# Patient Record
Sex: Female | Born: 1970 | Race: White | Hispanic: No | Smoking: Never smoker
Health system: Southern US, Community
[De-identification: ages and names within clinical notes are randomized; demographics above are authoritative.]

## PROBLEM LIST (undated history)

## (undated) DIAGNOSIS — G4730 Sleep apnea, unspecified: Secondary | ICD-10-CM

## (undated) DIAGNOSIS — U099 Post covid-19 condition, unspecified: Secondary | ICD-10-CM

## (undated) DIAGNOSIS — R87619 Unspecified abnormal cytological findings in specimens from cervix uteri: Secondary | ICD-10-CM

## (undated) DIAGNOSIS — A599 Trichomoniasis, unspecified: Secondary | ICD-10-CM

## (undated) DIAGNOSIS — J189 Pneumonia, unspecified organism: Secondary | ICD-10-CM

## (undated) DIAGNOSIS — N939 Abnormal uterine and vaginal bleeding, unspecified: Secondary | ICD-10-CM

## (undated) DIAGNOSIS — G935 Compression of brain: Secondary | ICD-10-CM

## (undated) DIAGNOSIS — F329 Major depressive disorder, single episode, unspecified: Secondary | ICD-10-CM

## (undated) DIAGNOSIS — D649 Anemia, unspecified: Secondary | ICD-10-CM

## (undated) DIAGNOSIS — I10 Essential (primary) hypertension: Secondary | ICD-10-CM

## (undated) DIAGNOSIS — E785 Hyperlipidemia, unspecified: Secondary | ICD-10-CM

## (undated) DIAGNOSIS — K8020 Calculus of gallbladder without cholecystitis without obstruction: Secondary | ICD-10-CM

## (undated) DIAGNOSIS — F32A Depression, unspecified: Secondary | ICD-10-CM

## (undated) DIAGNOSIS — D219 Benign neoplasm of connective and other soft tissue, unspecified: Secondary | ICD-10-CM

## (undated) DIAGNOSIS — N870 Mild cervical dysplasia: Secondary | ICD-10-CM

## (undated) DIAGNOSIS — K219 Gastro-esophageal reflux disease without esophagitis: Secondary | ICD-10-CM

## (undated) DIAGNOSIS — E559 Vitamin D deficiency, unspecified: Secondary | ICD-10-CM

## (undated) DIAGNOSIS — T8859XA Other complications of anesthesia, initial encounter: Secondary | ICD-10-CM

## (undated) DIAGNOSIS — C801 Malignant (primary) neoplasm, unspecified: Secondary | ICD-10-CM

## (undated) HISTORY — DX: Sleep apnea, unspecified: G47.30

## (undated) HISTORY — DX: Abnormal uterine and vaginal bleeding, unspecified: N93.9

## (undated) HISTORY — DX: Essential (primary) hypertension: I10

## (undated) HISTORY — DX: Hyperlipidemia, unspecified: E78.5

## (undated) HISTORY — DX: Gastro-esophageal reflux disease without esophagitis: K21.9

## (undated) HISTORY — DX: Benign neoplasm of connective and other soft tissue, unspecified: D21.9

## (undated) HISTORY — DX: Mild cervical dysplasia: N87.0

## (undated) HISTORY — DX: Pneumonia, unspecified organism: J18.9

## (undated) HISTORY — DX: Unspecified abnormal cytological findings in specimens from cervix uteri: R87.619

## (undated) HISTORY — PX: ABDOMINAL HYSTERECTOMY: SHX81

## (undated) HISTORY — DX: Trichomoniasis, unspecified: A59.9

## (undated) HISTORY — DX: Anemia, unspecified: D64.9

## (undated) HISTORY — PX: TONSILLECTOMY: SUR1361

## (undated) HISTORY — DX: Major depressive disorder, single episode, unspecified: F32.9

## (undated) HISTORY — DX: Depression, unspecified: F32.A

## (undated) HISTORY — DX: Calculus of gallbladder without cholecystitis without obstruction: K80.20

## (undated) HISTORY — DX: Malignant (primary) neoplasm, unspecified: C80.1

## (undated) HISTORY — DX: Compression of brain: G93.5

## (undated) HISTORY — DX: Vitamin D deficiency, unspecified: E55.9

## (undated) HISTORY — PX: DILATION AND CURETTAGE OF UTERUS: SHX78

---
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
## 1990-04-06 DIAGNOSIS — G935 Compression of brain: Secondary | ICD-10-CM

## 1990-04-06 HISTORY — DX: Compression of brain: G93.5

## 1993-11-10 DIAGNOSIS — C801 Malignant (primary) neoplasm, unspecified: Secondary | ICD-10-CM

## 1993-11-10 HISTORY — DX: Malignant (primary) neoplasm, unspecified: C80.1

## 1994-11-10 HISTORY — PX: GYNECOLOGIC CRYOSURGERY: SHX857

## 1998-04-06 ENCOUNTER — Inpatient Hospital Stay (HOSPITAL_COMMUNITY): Admission: AD | Admit: 1998-04-06 | Discharge: 1998-04-08 | Payer: Self-pay | Admitting: Obstetrics and Gynecology

## 1998-11-10 DIAGNOSIS — N939 Abnormal uterine and vaginal bleeding, unspecified: Secondary | ICD-10-CM

## 1998-11-10 DIAGNOSIS — D219 Benign neoplasm of connective and other soft tissue, unspecified: Secondary | ICD-10-CM

## 1998-11-10 HISTORY — PX: BREAST LUMPECTOMY: SHX2

## 1998-11-10 HISTORY — DX: Abnormal uterine and vaginal bleeding, unspecified: N93.9

## 1998-11-10 HISTORY — DX: Benign neoplasm of connective and other soft tissue, unspecified: D21.9

## 1999-04-02 ENCOUNTER — Emergency Department (HOSPITAL_COMMUNITY): Admission: EM | Admit: 1999-04-02 | Discharge: 1999-04-02 | Payer: Self-pay | Admitting: Emergency Medicine

## 1999-04-08 ENCOUNTER — Encounter: Admission: RE | Admit: 1999-04-08 | Discharge: 1999-04-08 | Payer: Self-pay | Admitting: *Deleted

## 2000-11-10 HISTORY — PX: CRANIECTOMY: SHX331

## 2000-11-24 ENCOUNTER — Encounter: Admission: RE | Admit: 2000-11-24 | Discharge: 2000-11-24 | Payer: Self-pay | Admitting: Family Medicine

## 2000-11-24 ENCOUNTER — Encounter: Payer: Self-pay | Admitting: Family Medicine

## 2000-12-21 ENCOUNTER — Inpatient Hospital Stay (HOSPITAL_COMMUNITY): Admission: RE | Admit: 2000-12-21 | Discharge: 2000-12-24 | Payer: Self-pay | Admitting: Neurosurgery

## 2002-05-17 ENCOUNTER — Other Ambulatory Visit: Admission: RE | Admit: 2002-05-17 | Discharge: 2002-05-17 | Payer: Self-pay | Admitting: Obstetrics and Gynecology

## 2002-08-27 ENCOUNTER — Encounter: Payer: Self-pay | Admitting: Family Medicine

## 2002-08-27 ENCOUNTER — Encounter: Admission: RE | Admit: 2002-08-27 | Discharge: 2002-08-27 | Payer: Self-pay | Admitting: Family Medicine

## 2003-01-25 ENCOUNTER — Encounter: Admission: RE | Admit: 2003-01-25 | Discharge: 2003-01-25 | Payer: Self-pay | Admitting: Family Medicine

## 2003-01-25 ENCOUNTER — Encounter: Payer: Self-pay | Admitting: Family Medicine

## 2003-01-30 ENCOUNTER — Encounter: Payer: Self-pay | Admitting: Family Medicine

## 2003-01-30 ENCOUNTER — Encounter: Admission: RE | Admit: 2003-01-30 | Discharge: 2003-01-30 | Payer: Self-pay | Admitting: Family Medicine

## 2003-02-24 ENCOUNTER — Ambulatory Visit (HOSPITAL_COMMUNITY): Admission: RE | Admit: 2003-02-24 | Discharge: 2003-02-24 | Payer: Self-pay | Admitting: Obstetrics and Gynecology

## 2003-02-24 ENCOUNTER — Encounter (INDEPENDENT_AMBULATORY_CARE_PROVIDER_SITE_OTHER): Payer: Self-pay

## 2003-04-05 ENCOUNTER — Ambulatory Visit (HOSPITAL_COMMUNITY): Admission: RE | Admit: 2003-04-05 | Discharge: 2003-04-05 | Payer: Self-pay | Admitting: Obstetrics and Gynecology

## 2003-04-05 ENCOUNTER — Encounter: Payer: Self-pay | Admitting: Obstetrics and Gynecology

## 2003-04-07 ENCOUNTER — Encounter: Payer: Self-pay | Admitting: Family Medicine

## 2003-04-07 ENCOUNTER — Encounter: Admission: RE | Admit: 2003-04-07 | Discharge: 2003-04-07 | Payer: Self-pay | Admitting: Family Medicine

## 2003-05-11 ENCOUNTER — Other Ambulatory Visit: Admission: RE | Admit: 2003-05-11 | Discharge: 2003-05-11 | Payer: Self-pay | Admitting: Obstetrics and Gynecology

## 2004-02-15 ENCOUNTER — Ambulatory Visit (HOSPITAL_COMMUNITY): Admission: RE | Admit: 2004-02-15 | Discharge: 2004-02-15 | Payer: Self-pay | Admitting: General Surgery

## 2004-02-15 ENCOUNTER — Ambulatory Visit (HOSPITAL_BASED_OUTPATIENT_CLINIC_OR_DEPARTMENT_OTHER): Admission: RE | Admit: 2004-02-15 | Discharge: 2004-02-15 | Payer: Self-pay | Admitting: General Surgery

## 2004-02-15 ENCOUNTER — Encounter (INDEPENDENT_AMBULATORY_CARE_PROVIDER_SITE_OTHER): Payer: Self-pay | Admitting: *Deleted

## 2004-05-17 ENCOUNTER — Other Ambulatory Visit: Admission: RE | Admit: 2004-05-17 | Discharge: 2004-05-17 | Payer: Self-pay | Admitting: Obstetrics and Gynecology

## 2005-05-20 ENCOUNTER — Other Ambulatory Visit: Admission: RE | Admit: 2005-05-20 | Discharge: 2005-05-20 | Payer: Self-pay | Admitting: Obstetrics and Gynecology

## 2007-05-21 ENCOUNTER — Encounter (HOSPITAL_COMMUNITY): Admission: RE | Admit: 2007-05-21 | Discharge: 2007-06-20 | Payer: Self-pay | Admitting: Orthopaedic Surgery

## 2007-06-22 ENCOUNTER — Encounter (HOSPITAL_COMMUNITY): Admission: RE | Admit: 2007-06-22 | Discharge: 2007-07-22 | Payer: Self-pay | Admitting: Orthopaedic Surgery

## 2008-07-19 ENCOUNTER — Ambulatory Visit: Payer: Self-pay | Admitting: Internal Medicine

## 2008-07-19 DIAGNOSIS — J029 Acute pharyngitis, unspecified: Secondary | ICD-10-CM | POA: Insufficient documentation

## 2008-10-06 ENCOUNTER — Emergency Department (HOSPITAL_COMMUNITY): Admission: EM | Admit: 2008-10-06 | Discharge: 2008-10-06 | Payer: Self-pay | Admitting: Emergency Medicine

## 2009-05-08 ENCOUNTER — Encounter: Admission: RE | Admit: 2009-05-08 | Discharge: 2009-05-08 | Payer: Self-pay | Admitting: Obstetrics and Gynecology

## 2009-05-09 ENCOUNTER — Ambulatory Visit (HOSPITAL_COMMUNITY): Admission: RE | Admit: 2009-05-09 | Discharge: 2009-05-09 | Payer: Self-pay | Admitting: Interventional Radiology

## 2009-07-19 ENCOUNTER — Encounter: Payer: Self-pay | Admitting: Obstetrics and Gynecology

## 2009-07-19 ENCOUNTER — Ambulatory Visit (HOSPITAL_COMMUNITY): Admission: RE | Admit: 2009-07-19 | Discharge: 2009-07-20 | Payer: Self-pay | Admitting: Obstetrics and Gynecology

## 2009-08-08 ENCOUNTER — Encounter: Admission: RE | Admit: 2009-08-08 | Discharge: 2009-08-08 | Payer: Self-pay | Admitting: Interventional Radiology

## 2009-11-10 HISTORY — PX: UTERINE ARTERY EMBOLIZATION: SHX2629

## 2009-12-06 ENCOUNTER — Ambulatory Visit (HOSPITAL_COMMUNITY): Admission: RE | Admit: 2009-12-06 | Discharge: 2009-12-06 | Payer: Self-pay | Admitting: Family Medicine

## 2009-12-13 ENCOUNTER — Ambulatory Visit (HOSPITAL_COMMUNITY): Admission: RE | Admit: 2009-12-13 | Discharge: 2009-12-13 | Payer: Self-pay | Admitting: Family Medicine

## 2010-02-06 ENCOUNTER — Encounter: Admission: RE | Admit: 2010-02-06 | Discharge: 2010-02-06 | Payer: Self-pay | Admitting: Interventional Radiology

## 2010-02-06 ENCOUNTER — Encounter: Admission: RE | Admit: 2010-02-06 | Discharge: 2010-02-06 | Payer: Self-pay | Admitting: Obstetrics and Gynecology

## 2010-03-04 ENCOUNTER — Ambulatory Visit: Admission: RE | Admit: 2010-03-04 | Discharge: 2010-03-04 | Payer: Self-pay | Admitting: Family Medicine

## 2010-03-14 ENCOUNTER — Encounter: Payer: Self-pay | Admitting: Family Medicine

## 2010-12-02 ENCOUNTER — Encounter: Payer: Self-pay | Admitting: Interventional Radiology

## 2010-12-10 NOTE — Miscellaneous (Signed)
Summary: appt with Dr. Gerilyn Pilgrim  Clinical Lists Changes [facility] referred her here to see Dr. Gerilyn Pilgrim. appt made & forms in Dr. Johnell Comings box.Golden Circle RN  Mar 14, 2010 4:43 PM

## 2011-02-14 LAB — CBC
HCT: 33 % — ABNORMAL LOW (ref 36.0–46.0)
Hemoglobin: 11.1 g/dL — ABNORMAL LOW (ref 12.0–15.0)
MCV: 84.7 fL (ref 78.0–100.0)
Platelets: 369 10*3/uL (ref 150–400)
RBC: 3.9 MIL/uL (ref 3.87–5.11)
RDW: 12.6 % (ref 11.5–15.5)
WBC: 8.5 10*3/uL (ref 4.0–10.5)

## 2011-02-14 LAB — HCG, SERUM, QUALITATIVE: Preg, Serum: NEGATIVE

## 2011-03-28 NOTE — Discharge Summary (Signed)
[place]  Patient:    Karen Avila, Karen Avila                  MRN: [id number] Adm. Date:  [id number] Disc. Date: 12/24/00 Attending:  Gerald Dexter                           Discharge Summary  PRIMARY DIAGNOSIS:  Chiari malformation.  PRIMARY OPERATIVE PROCEDURE:  Suboccipital craniotomy and C1 laminectomy for decompression of Chiari malformation.  HISTORY OF PRESENT ILLNESS:  Karen Avila is a 40 year old female who presented to the office with a history of increasing headaches particularly associated with straining, coughing, or hard laughing.  These were in the occipital region and were getting progressively worse.  She had been evaluated with an MRI scan which showed a Chiari malformation.  After evaluation and discussing the options, she requested surgery.  She was admitted at this time for surgical intervention.  HOSPITAL COURSE:  The patient was admitted on December 21, 2000.  She was taken to the operating room where she underwent the suboccipital craniectomy which she tolerated without difficulty.  Her postoperative course was unremarkable.  She was transferred the ACU on the first postoperative day.  On the second postoperative day, she was able to increase her activity and advance her diet without difficulty.  By December 24, 2000, the patient was up ambulating well and tolerating a regular diet.  Her wound was healing well. It was felt that she could be discharged home.  DISCHARGE MEDICATIONS:  Vicodin for pain.  FOLLOW-UP:  The plan was for the patient to return to the office in five days time for suture removal.  CONDITION ON DISCHARGE:  Improved versus admission. DD:  12/24/00 TD:  12/25/00 Job: [id number]

## 2011-03-28 NOTE — Op Note (Signed)
NAME:  Karen Avila, Karen Avila                     ACCOUNT NO.:  [id number]   MEDICAL RECORD NO.:  [id number]                   PATIENT TYPE:  AMB   LOCATION:  SDC                                  FACILITY:  [facility]   PHYSICIAN:  Randye Lobo, M.D.                DATE OF BIRTH:  11-Mar-1971   DATE OF PROCEDURE:  02/24/2003  DATE OF DISCHARGE:                                 OPERATIVE REPORT   PREOPERATIVE DIAGNOSES:  1. Missed abortion.  2. 6+[redacted] weeks gestation.   POSTOPERATIVE DIAGNOSES:  1. Missed abortion.  2. 6+[redacted] weeks gestation.   PROCEDURE:  Dilation and evacuation.   SURGEON:  Randye Lobo, M.D.   ANESTHESIA:  MAC, paracervical block.   IV FLUIDS:  700 mL Ringer's lactate.   ESTIMATED BLOOD LOSS:  Minimal.   URINE OUTPUT:  100 mL prior to procedure by I&O catheterization.   COMPLICATIONS:  None.   INDICATIONS FOR PROCEDURE:  The patient is a 40 year old gravida 4, para 1-0-  2-1 Caucasian female with a last menstrual period December 28, 2002 who had  a positive pregnancy test done February 10, 2003 and an ultrasound demonstrating  a viable intrauterine gestation at 5+6 weeks on February 14, 2003.  The patient  presented on February 21, 2003 to the office with bleeding and cramping.  At  that time ultrasound documented a 6+[redacted] week gestation with no fetal cardiac  activity.  The gestational sac at that time was irregular and appeared to be  collapsing.  Initially, the patient chose to proceed with spontaneous  process for the missed abortion but then she decided to proceed with a D&C  instead.  The patient was counseled regarding risks, benefits, and  alternatives prior to the surgery.   FINDINGS:  Examination under anesthesia revealed a 6 week sized anteverted,  mobile uterus.  No adnexal masses were appreciated.  There was bloody mucus  coming from the cervical os.   A small amount of products of conception were obtained and sent to  pathology.   PROCEDURE:  With an IV in  place the patient was taken to the operating room  after she was properly identified.  The patient did receive Ancef 1 g  intravenously for antibiotic prophylaxis.  The patient was brought down to  the operating room suite and she was placed in the dorsal lithotomy  position.  MAC anesthesia was induced.  The patient's vagina and perineum  were then sterilely prepped and the bladder was catheterized of urine.  The  patient was then sterilely draped and an examination under anesthesia was  performed.   A speculum was placed in the vagina.  The single tooth tenaculum was placed  on the anterior cervical lip.  The uterus was sounded and then the cervix  was sequentially dilated to a number 23 Pratt dilator.  Prior to the  placement of the uterine sound, please note that a  paracervical block was  performed in a standard fashion with a total of 10 mL of 1% lidocaine.  A  number 7 suction tip curette was next introduced through the cervical os to  the level of the uterine fundus and proper suction was applied.  The  catheter tip was turned in a clockwise fashion while withdrawing the  catheter from within the uterine cavity.  This was repeated two additional  times to remove products of conception and sent them to pathology.  A sharp  curette was then introduced through the cervix to the level of the uterine  fundus and all four quadrants were gently curetted to remove remaining  products of conception.  The suction tip curette was introduced into the  uterine cavity one final time and again was removed while turning in a  clockwise fashion to remove any remaining blood clots.  Hemostasis was noted  to be excellent.  At this time all instruments were removed from the vagina  and the patient's procedure was concluded.   The patient was awakened and sent to the recovery room in stable and awake  condition.  There were no complications.  All needle, sponge, and instrument  counts were  correct.                                               Randye Lobo, M.D.    BES/MEDQ  D:  02/24/2003  T:  02/24/2003  Job:  [id number]

## 2011-03-28 NOTE — Op Note (Signed)
[place]  Patient:    Karen Avila, Karen Avila                    MRN: [id number] Proc. Date: 12/21/00 Adm. Date:  12/21/00 Attending:  Reinaldo Meeker, M.D.                           Operative Report  PREOPERATIVE DIAGNOSIS:  Chiari malformation.  POSTOPERATIVE DIAGNOSIS:  Chiari malformation.  PROCEDURE:  Suboccipital craniectomy, C1 laminectomy for decompression of Chiari malformation, followed by dural patch graft.   Secondary procedure of microdissection of suboccipital region and cerebellar tonsils.  SURGEON:  Reinaldo Meeker, M.D.  ASSISTANT:  Julio Sicks, M.D.  PROCEDURE IN DETAIL:  After being placed in the supine position and 3-point pin fixation, the patients occipital region and neck were shaved, prepped and draped in usual sterile fashion.  A linear incision was made starting in the low edges of the occipital bone headed towards the spinous process of C2. Using the Bovie current, the soft tissues were dissected in the midline.  A very thorough exposure was carried out of the suboccipital region down to the foramen magnum, as well as the ring of C1 and the spinous processes and lamina of C2.  Self-retaining retractors were placed for exposure.  The high-speed drill was used to make two holes in the suboccipital region bone and that high-speed drill was used to thin out the surrounding area of the suboccipital region.  The bone was then removed in a piecemeal fashion using Kerrison and Leksell rongeurs.  This was carried down to the foramen magnum, which was widely opened to complete the suboccipital craniectomy.  The ring of C1 was then drilled thin with the high-speed drill and then removed posteriorly to complete the decompression at that level.  At this point, irrigation was carried out.  A dural suture was then placed in the dura and the dura was opened in a linear fashion starting just superior to the C2 lamina and heading up  into the higher areas of the suboccipital region that had been exposed. Traction sutures were used to spread the dura widely and any bleeding on the dural edges was controlled with bipolar coagulation.  The microscope was draped and brought into the field at this time.  I used microdissection technique.  The cerebellar tonsils were identified and gently dissected free of each other with arachnoidal dissection carried out until the choroid plexus of the fourth ventricle and the ______ could be identified.  No further adhesions were noted between the cerebellar tonsils.  Large amount of irrigation were carried out at this time and bleeding controlled with bipolar coagulation.  An appropriately lengthed sized dural patch graft was then fashioned to the appropriate shape and sewed with interrupted Nurolon stitches.  Once this was well-secured, Tisseel fibrin glue product was placed over it to secure watertight closure.  The wound was then covered with Gelfoam and irrigated copiously.  It was then closed in multiple layers of Vicryl and a running locking nylon on the skin.  A sterile dressing was then applied. The patient was then taken to the recovery room in stable condition. DD:  12/21/00 TD:  12/21/00 Job: [id number]

## 2011-03-28 NOTE — Op Note (Signed)
NAME:  Karen Avila, Karen Avila                     ACCOUNT NO.:  [id number]   MEDICAL RECORD NO.:  [id number]                   PATIENT TYPE:  AMB   LOCATION:  DSC                                  FACILITY:  [facility]   PHYSICIAN:  Rose Phi. Maple Hudson, M.D.                DATE OF BIRTH:  Nov 28, 1970   DATE OF PROCEDURE:  02/15/2004  DATE OF DISCHARGE:                                 OPERATIVE REPORT   PREOPERATIVE DIAGNOSIS:  Intraductal papilloma of the left breast.   POSTOPERATIVE DIAGNOSIS:  Intraductal papilloma of the left breast.   OPERATION:  Excision of intraductal papilloma of the left breast.   SURGEON:  Rose Phi. Maple Hudson, M.D.   ANESTHESIA:  MAC.   OPERATIVE PROCEDURE:  The patient placed on the operating table and the left  breast prepped and draped in the usual fashion.  It appeared that the clear  fluid that was coming was from about the 7 o'clock position of the left  breast.  A circumareolar incision based at the 6 o'clock position was then  outlined with a marking pencil and the area thoroughly infiltrated with a  mixture of 1% Xylocaine and 0.25% Marcaine.  Incision was made, and I  dissected an areolar flap and then began to excise this and identified  clearly the papilloma, and we excised that area of the duct system.  Hemostasis obtained with the cautery.  Subcuticular closure of 4-0 Monocryl  and Steri-Strips carried out.  Dressing applied.  Patient transferred to the  recovery room in satisfactory condition, having tolerated the procedure  well.                                               Rose Phi. Maple Hudson, M.D.    PRY/MEDQ  D:  02/15/2004  T:  02/15/2004  Job:  [id number]

## 2013-01-21 ENCOUNTER — Encounter (HOSPITAL_BASED_OUTPATIENT_CLINIC_OR_DEPARTMENT_OTHER): Payer: Self-pay | Admitting: *Deleted

## 2013-01-21 ENCOUNTER — Emergency Department (HOSPITAL_BASED_OUTPATIENT_CLINIC_OR_DEPARTMENT_OTHER): Payer: BC Managed Care – PPO

## 2013-01-21 ENCOUNTER — Emergency Department (HOSPITAL_BASED_OUTPATIENT_CLINIC_OR_DEPARTMENT_OTHER)
Admission: EM | Admit: 2013-01-21 | Discharge: 2013-01-22 | Disposition: A | Discharge status: Home / Self Care | Payer: BC Managed Care – PPO | Attending: Emergency Medicine | Admitting: Emergency Medicine

## 2013-01-21 DIAGNOSIS — D259 Leiomyoma of uterus, unspecified: Secondary | ICD-10-CM | POA: Insufficient documentation

## 2013-01-21 DIAGNOSIS — N760 Acute vaginitis: Secondary | ICD-10-CM | POA: Insufficient documentation

## 2013-01-21 DIAGNOSIS — B9689 Other specified bacterial agents as the cause of diseases classified elsewhere: Secondary | ICD-10-CM

## 2013-01-21 DIAGNOSIS — Z3202 Encounter for pregnancy test, result negative: Secondary | ICD-10-CM | POA: Insufficient documentation

## 2013-01-21 LAB — WET PREP, GENITAL: Yeast Wet Prep HPF POC: NONE SEEN

## 2013-01-21 LAB — URINALYSIS, ROUTINE W REFLEX MICROSCOPIC
Bilirubin Urine: NEGATIVE
Glucose, UA: NEGATIVE mg/dL
Ketones, ur: NEGATIVE mg/dL
Leukocytes, UA: NEGATIVE
Specific Gravity, Urine: 1.027 (ref 1.005–1.030)
pH: 5 (ref 5.0–8.0)

## 2013-01-21 LAB — URINE MICROSCOPIC-ADD ON

## 2013-01-21 MED ORDER — METRONIDAZOLE 500 MG PO TABS: 500.0000 mg | ORAL_TABLET | Freq: Two times a day (BID) | ORAL | Status: DC

## 2013-01-21 MED ORDER — LIDOCAINE HCL (PF) 1 % IJ SOLN
5.0000 mL | Freq: Once | INTRAMUSCULAR | Status: AC
  Administered 2013-01-21: 2.2 mL

## 2013-01-21 MED ORDER — CEFTRIAXONE SODIUM 250 MG IJ SOLR
250.0000 mg | Freq: Once | INTRAMUSCULAR | Status: AC
  Administered 2013-01-21: 250 mg via INTRAMUSCULAR
  Filled 2013-01-21: qty 250

## 2013-01-21 MED ORDER — LIDOCAINE HCL (PF) 1 % IJ SOLN
INTRAMUSCULAR | Status: AC
  Administered 2013-01-21: 2.2 mL
  Filled 2013-01-21: qty 5

## 2013-01-21 MED ORDER — AZITHROMYCIN 250 MG PO TABS
1000.0000 mg | ORAL_TABLET | Freq: Once | ORAL | Status: AC
  Administered 2013-01-21: 1000 mg via ORAL
  Filled 2013-01-21: qty 4

## 2013-01-21 NOTE — ED Notes (Signed)
States she has a tampon stuck in her her vagina. Has been there for a month. Vaginal bleeding and odor.

## 2013-01-21 NOTE — ED Provider Notes (Signed)
History     CSN: [id number]  Arrival date & time 01/21/13  1933   First MD Initiated Contact with Patient 01/21/13 2123      Chief Complaint  Patient presents with  . Foreign Body in Vagina    (Consider location/radiation/quality/duration/timing/severity/associated sxs/prior treatment) Patient is a 42 y.o. female presenting with foreign body in vagina. The history is provided by the patient. No language interpreter was used.  Foreign Body in Vagina This is a new problem. The current episode started more than 1 month ago. The problem occurs constantly. The problem has been gradually worsening. Nothing aggravates the symptoms. She has tried nothing for the symptoms. The treatment provided moderate relief.  Pt complains of vaginal discharge and odor.  Pt reports she thinks she has a tampon in.   Pt reports she thought it had been in for a month.  Pt is an ultrasound tech and looked with ultrasound.   Pt thinks she sees a foreign body  History reviewed. No pertinent past medical history.  Past Surgical History  Procedure Laterality Date  . Appendectomy      No family history on file.  History  Substance Use Topics  . Smoking status: Never Smoker   . Smokeless tobacco: Not on file  . Alcohol Use: Yes    OB History   Grav Para Term Preterm Abortions TAB SAB Ect Mult Living                  Review of Systems  Genitourinary: Positive for vaginal discharge.  All other systems reviewed and are negative.    Allergies  Review of patient's allergies indicates no known allergies.  Home Medications  No current outpatient prescriptions on file.  BP 129/78  Pulse 78  Temp(Src) 98.2 F (36.8 C) (Oral)  Resp 16  Ht 5\' 2"  (1.575 m)  Wt 151 lb (68.493 kg)  BMI 27.61 kg/m2  SpO2 100%  LMP 12/11/2012  Physical Exam  Constitutional: She is oriented to person, place, and time. She appears well-developed and well-nourished.  HENT:  Head: Normocephalic.  Eyes: EOM are  normal.  Neck: Normal range of motion.  Cardiovascular: Normal rate and regular rhythm.   Pulmonary/Chest: Effort normal.  Abdominal: Soft. She exhibits no distension.  Genitourinary: Vaginal discharge found.  Greenish discharge  No foreign body,   Cervical cyst.    Musculoskeletal: Normal range of motion.  Neurological: She is alert and oriented to person, place, and time.  Psychiatric: She has a normal mood and affect.    ED Course  Procedures (including critical care time)  Labs Reviewed  WET PREP, GENITAL - Abnormal; Notable for the following:    Clue Cells Wet Prep HPF POC FEW (*)    WBC, Wet Prep HPF POC FEW (*)    All other components within normal limits  URINALYSIS, ROUTINE W REFLEX MICROSCOPIC - Abnormal; Notable for the following:    Hgb urine dipstick SMALL (*)    All other components within normal limits  GC/CHLAMYDIA PROBE AMP  PREGNANCY, URINE  URINE MICROSCOPIC-ADD ON   No results found.   1. BV (bacterial vaginosis)   2. Fibroid, uterine       MDM  Ultrasound shows a fibroid.   gc and ct pending.   Pt given rocephin and zithromax and flaggyl rx  Pt advised to see Dr. Edward Jolly for evaluation        Elson Areas, PA-C 01/21/13 2306  Lonia Skinner [place], PA-C 01/21/13 2308

## 2013-01-22 NOTE — ED Provider Notes (Signed)
Medical screening examination/treatment/procedure(s) were performed by non-physician practitioner and as supervising physician I was immediately available for consultation/collaboration.   Carleene Cooper III, MD 01/22/13 [phone]

## 2013-01-26 ENCOUNTER — Ambulatory Visit: Payer: Self-pay | Admitting: Obstetrics and Gynecology

## 2013-02-08 ENCOUNTER — Ambulatory Visit: Payer: Self-pay | Admitting: Obstetrics and Gynecology

## 2013-02-10 ENCOUNTER — Encounter: Payer: Self-pay | Admitting: Obstetrics and Gynecology

## 2013-02-10 ENCOUNTER — Ambulatory Visit (INDEPENDENT_AMBULATORY_CARE_PROVIDER_SITE_OTHER): Payer: BC Managed Care – PPO | Admitting: Obstetrics and Gynecology

## 2013-02-10 ENCOUNTER — Other Ambulatory Visit: Payer: Self-pay | Admitting: Obstetrics and Gynecology

## 2013-02-10 VITALS — BP 122/70 | Ht 61.5 in | Wt 151.0 lb

## 2013-02-10 DIAGNOSIS — R921 Mammographic calcification found on diagnostic imaging of breast: Secondary | ICD-10-CM

## 2013-02-10 DIAGNOSIS — N6012 Diffuse cystic mastopathy of left breast: Secondary | ICD-10-CM

## 2013-02-10 DIAGNOSIS — N6019 Diffuse cystic mastopathy of unspecified breast: Secondary | ICD-10-CM

## 2013-02-10 DIAGNOSIS — Z01419 Encounter for gynecological examination (general) (routine) without abnormal findings: Secondary | ICD-10-CM

## 2013-02-10 DIAGNOSIS — Z0000 Encounter for general adult medical examination without abnormal findings: Secondary | ICD-10-CM

## 2013-02-10 DIAGNOSIS — R928 Other abnormal and inconclusive findings on diagnostic imaging of breast: Secondary | ICD-10-CM

## 2013-02-10 LAB — POCT URINALYSIS DIPSTICK
Glucose, UA: NEGATIVE
Ketones, UA: NEGATIVE
Leukocytes, UA: NEGATIVE
Nitrite, UA: NEGATIVE
Protein, UA: NEGATIVE

## 2013-02-10 NOTE — Addendum Note (Signed)
Addended by: Conley Simmonds on: 02/10/2013 10:22 PM   Modules accepted: Orders

## 2013-02-10 NOTE — Patient Instructions (Signed)

## 2013-02-10 NOTE — Progress Notes (Signed)
Patient ID: Karen Avila, female   DOB: 1970/12/20, 42 y.o.   MRN: [id number]  42 y.o. DivorcedCaucasian female  [id number] here for annual exam.   Status post uterine artery embolization Sept. 2011.  Menses light since procedure, just last a week.  February menses spotting or light bleeding for most of the month.  Went to ER for evaluation - no retained tampon.  Treated with Flagyl.  Menses normal in March.  Occasional cramping.    Patient's last menstrual period was 01/08/2013.          Sexually active: yes  The current method of family planning is condoms most of the time.    Exercising:  No.  Walks the dog.   Last mammogram:  June 2013.  Calcifications.  Had right stereotactic biopsy 2013 at [place] near [place].  Believes she had a cyst of the left as well.  Ordered through PCP.   Last pap smear:  2012, normal. History of abnormal pap:  1996 - had cryotherapy.  Follow up paps normal.   Smoking:  No. Alcohol:  1 -2 drinks a week. Last colonoscopy:  Never. Last Bone Density:  Never. Last tetanus shot:  March 2014. Last cholesterol check:  2012 - HDL was low, LDL Ok per patient.  Dietary recommendations only.    Hgb:  12.1              Urine:  Negative    Health Maintenance  Topic Date Due  . Influenza Vaccine  07/11/1970  . Pap Smear  06/17/1988  . Tetanus/tdap  06/17/1989    Family History  Problem Relation Age of Onset  . Osteoarthritis Mother   . Asthma Mother   . Cancer Mother     skin cancer  . Hyperlipidemia Mother   . Hypertension Mother   . Migraines Mother   . Stroke Mother   . Hyperlipidemia Father   . Hypertension Father   . Diabetes Sister   . Breast cancer Maternal Grandmother   . Heart failure Maternal Grandmother     Patient Active Problem List  Diagnosis  . EXUDATIVE PHARYNGITIS    Past Medical History  Diagnosis Date  . Fibroid 2000  . Abnormal uterine bleeding 2000    fibroids  . Anemia     past h/o anemia  . 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 Migraines     Past Surgical History  Procedure Laterality Date  . Appendectomy  1977  . Breast surgery Left 2000    lumpectomy  . Gynecologic cryosurgery  1996  . Uterine artery embolization  2011    Allergies: Review of patient's allergies indicates no known allergies.  Current Outpatient Prescriptions  Medication Sig Dispense Refill  . Acetaminophen (TYLENOL PO) Take by mouth as needed.       No current facility-administered medications for this visit.    ROS: Pertinent items are noted in HPI.  Exam:    BP 122/70  Ht 5' 1.5" (1.562 m)  Wt 151 lb (68.493 kg)  BMI 28.07 kg/m2  LMP 01/08/2013   Wt Readings from Last 3 Encounters:  02/10/13 151 lb (68.493 kg)  01/21/13 151 lb (68.493 kg)     Ht Readings from Last 3 Encounters:  02/10/13 5' 1.5" (1.562 m)  01/21/13 5\' 2"  (1.575 m)    General appearance: alert, cooperative and appears stated age Head:  Normocephalic, without obvious abnormality, atraumatic Neck: no adenopathy, supple, symmetrical, trachea midline and thyroid not enlarged, symmetric, no tenderness/mass/nodules Lungs: clear to auscultation bilaterally Breasts: Inspection negative, No nipple retraction or dimpling, No nipple discharge or bleeding, No axillary or supraclavicular adenopathy, Normal to palpation without dominant masses.  Left breast with areolar scar consistent with prior surgery. Heart: regular rate and rhythm Abdomen: soft, non-tender; bowel sounds normal; no masses,  no organomegaly Extremities: extremities normal, atraumatic, no cyanosis or edema Skin: Skin color, texture, turgor normal. No rashes or lesions Lymph nodes: Cervical, supraclavicular, and axillary nodes normal. No abnormal inguinal nodes palpated Neurologic: Grossly normal   Pelvic: External genitalia:  no lesions              Urethra:  normal appearing urethra with no  masses, tenderness or lesions              Bartholins and Skenes: normal                 Vagina: normal appearing vagina with normal color and discharge, no lesions.  Friable.              Cervix: normal appearance              Pap taken: yes and HR HPV        Bimanual Exam:  Uterus:  uterus is somewhat enlarged in size.  Nontender.  Exam limited by full bladder and body habitus.                                      Adnexa: normal adnexa in size, nontender and no masses                                      Rectovaginal: Confirms                                      Anus:  normal sphincter tone, no lesions  A: Fibroid uterus.  Status post embolization. Calcifications of right breast. Fibrocystic change of the left breast.   History of cervical cryotherapy.     P[facility] Pap and HR HPV testing. Observations of cycles. return annually or prn     An After Visit Summary was printed and given to the patient.

## 2013-02-11 ENCOUNTER — Ambulatory Visit: Payer: Self-pay | Admitting: Obstetrics and Gynecology

## 2013-02-11 ENCOUNTER — Ambulatory Visit: Payer: BC Managed Care – PPO | Admitting: Obstetrics and Gynecology

## 2013-02-14 LAB — HEMOGLOBIN, FINGERSTICK: Hemoglobin, fingerstick: 12.1 g/dL (ref 12.0–16.0)

## 2013-02-24 ENCOUNTER — Other Ambulatory Visit: Payer: Self-pay

## 2013-03-04 LAB — IPS PAP TEST WITH HPV

## 2013-03-11 ENCOUNTER — Ambulatory Visit
Admission: RE | Admit: 2013-03-11 | Discharge: 2013-03-11 | Disposition: A | Discharge status: Home / Self Care | Payer: BC Managed Care – PPO | Source: Ambulatory Visit | Attending: Obstetrics and Gynecology | Admitting: Obstetrics and Gynecology

## 2013-03-11 DIAGNOSIS — N6012 Diffuse cystic mastopathy of left breast: Secondary | ICD-10-CM

## 2013-03-11 DIAGNOSIS — R921 Mammographic calcification found on diagnostic imaging of breast: Secondary | ICD-10-CM

## 2013-09-20 ENCOUNTER — Telehealth: Payer: Self-pay | Admitting: Obstetrics and Gynecology

## 2013-09-20 NOTE — Telephone Encounter (Signed)
Patient is having some menopausal symptoms.

## 2013-09-20 NOTE — Telephone Encounter (Signed)
Spoke with pt about symptoms. Pt is having more crankiness, mood swings, and hot flashes than normal. Pt talked to co worker who is on effexor, and is wondering if that would be a good choice for her. Pt was on seraphim years ago during her divorce, but it helped only with mood swings, not hot flashes. Scheduled appt with PG 09-30-13 at 2:15 as there are no appts available with BS.

## 2013-09-30 ENCOUNTER — Encounter: Payer: Self-pay | Admitting: Nurse Practitioner

## 2013-09-30 ENCOUNTER — Ambulatory Visit (INDEPENDENT_AMBULATORY_CARE_PROVIDER_SITE_OTHER): Payer: BC Managed Care – PPO | Admitting: Nurse Practitioner

## 2013-09-30 VITALS — BP 118/60 | HR 82 | Resp 18 | Wt 156.0 lb

## 2013-09-30 DIAGNOSIS — N951 Menopausal and female climacteric states: Secondary | ICD-10-CM

## 2013-09-30 LAB — LIPID PANEL
Cholesterol: 238 mg/dL — ABNORMAL HIGH (ref 0–200)
HDL: 52 mg/dL (ref >39)
Total CHOL/HDL Ratio: 4.6 Ratio
Triglycerides: 206 mg/dL — ABNORMAL HIGH (ref <150)
VLDL: 41 mg/dL — ABNORMAL HIGH (ref 0–40)

## 2013-09-30 LAB — COMPREHENSIVE METABOLIC PANEL
AST: 12 U/L (ref 0–37)
Alkaline Phosphatase: 69 U/L (ref 39–117)
BUN: 8 mg/dL (ref 6–23)
Creat: 0.64 mg/dL (ref 0.50–1.10)
Glucose, Bld: 80 mg/dL (ref 70–99)
Potassium: 3.7 mEq/L (ref 3.5–5.3)
Total Bilirubin: 0.5 mg/dL (ref 0.3–1.2)

## 2013-09-30 MED ORDER — METRONIDAZOLE 1 % EX GEL: Freq: Every day | CUTANEOUS | Status: DC

## 2013-09-30 MED ORDER — VENLAFAXINE HCL ER 37.5 MG PO CP24: 37.5000 mg | ORAL_CAPSULE | Freq: Every day | ORAL | Status: DC

## 2013-09-30 NOTE — Progress Notes (Signed)
Subjective:     Patient ID: Karen Avila, female   DOB: 1971/03/26, 42 y.o.   MRN: [id number]  HPI  This 42 yo WD Fe presents with history of irregular menses at 6-8 weeks apart.  Flow is normal.  She is having increase in vaso symptoms especially at night.  The most worrisome thing for her is the mood swings and irritability.  She has been of Prozac in the past for PMDD which was very helpful.  She would like to try Effexor since it will also help with hot flashes.  She is not dating now.  No other early menopausal symptoms - but does stare all female members of her family went through menopause early.  She also has problems with Rosacea and wants refill of Metrogel 1 % aqueous gel for her face.  The original Rx given by Dr. Edward Jolly.   Review of Systems  Constitutional: Negative.   HENT: Negative.   Respiratory: Negative.   Cardiovascular: Negative.   Gastrointestinal: Negative.   Genitourinary: Positive for menstrual problem.       Irregular menses  Musculoskeletal: Negative.   Skin: Positive for rash.       Hair thinning.  Neurological: Negative.   Psychiatric/Behavioral: Positive for sleep disturbance, dysphoric mood, decreased concentration and agitation. The patient is nervous/anxious.        Objective:   Physical Exam  Constitutional: She is oriented to person, place, and time. She appears well-developed and well-nourished. No distress.  Physical exam not indicated at this time.  Neurological: She is alert and oriented to person, place, and time.  Skin: Skin is warm and dry.  Psychiatric: She has a normal mood and affect. Her behavior is normal. Thought content normal.  Denies suicidal thoughts and behaviors.       Assessment:     History of PMS/ PMDD Perimenopausal symptoms. Rosacea    Plan:     Will start on Effexor XR 37.5 mg daily until next AEX first of year with Dr. Edward Jolly - then review effectiveness.  She is to report any negative mood changes or thoughts of  suicide.  A refill on Metrogel is also given per request. Baseline labs are drawn before med's are started and will be repeated at AEX with cholesterol and CMP.

## 2013-09-30 NOTE — Patient Instructions (Signed)
Recheck at AEX.  Will call with labs.

## 2013-10-01 LAB — VITAMIN D 25 HYDROXY (VIT D DEFICIENCY, FRACTURES): Vit D, 25-Hydroxy: 14 ng/mL — ABNORMAL LOW (ref 30–89)

## 2013-10-03 NOTE — Progress Notes (Signed)
Encounter reviewed by Dr. Leathia Farnell Silva.  

## 2013-10-04 ENCOUNTER — Encounter: Payer: Self-pay | Admitting: Nurse Practitioner

## 2013-10-05 ENCOUNTER — Other Ambulatory Visit: Payer: Self-pay | Admitting: Nurse Practitioner

## 2013-10-05 MED ORDER — VITAMIN D (ERGOCALCIFEROL) 1.25 MG (50000 UNIT) PO CAPS: 50000.0000 [IU] | ORAL_CAPSULE | ORAL | Status: DC

## 2013-10-05 NOTE — Telephone Encounter (Signed)
I have attempted to contact this patient by phone with the following results: left message to return my call on answering machine (home).  

## 2013-10-05 NOTE — Progress Notes (Signed)
Vitamin D rx sent per result note. 

## 2013-10-05 NOTE — Telephone Encounter (Signed)
Message copied by Luisa Dago on Wed Oct 05, 2013 10:45 AM ------      Message from: Ria Comment R      Created: Mon Oct 03, 2013  8:37 AM       Let patient know that Lipid panel is elevated.  We dont have a previous chart to compare.  She needs to follow with PCP about this.  We also have a very low Vit D and needs Vit D per protocol.  CMP and TSH are both normal. ------

## 2013-12-15 ENCOUNTER — Telehealth: Payer: Self-pay | Admitting: Obstetrics and Gynecology

## 2013-12-15 NOTE — Telephone Encounter (Signed)
Patient has some questions about her medications She only has 1 vitamin D left didn't know if she needed to continue the same one or if she was switching to over the counter She was started on effexor but it is not helping with the hotflashes

## 2013-12-16 MED ORDER — VENLAFAXINE HCL ER 75 MG PO CP24: 75.0000 mg | ORAL_CAPSULE | Freq: Every day | ORAL | Status: DC

## 2013-12-16 NOTE — Telephone Encounter (Signed)
2 things: we can give enough Vit D until her AEX with Dr. Quincy Simmonds - then repeat her labs.  Her Vit D was quite low at 14. We also started her Effexor off on purpose at a low dose - so lets increase to Effexor XR 75 mg daily until AEX on 02/17/14 with Dr. Quincy Simmonds and can also decide if that dose if helpful.

## 2013-12-16 NOTE — Telephone Encounter (Signed)
Spoke with patient. She states that the Effexor is not helping with hot flashes. She does not want HRT but wondering if there is something that can be used with Effexor to help. States that it is helping with mood but not with hot flashes. Does she need to increase dose or what else could she do?  Vitamin D, patient has refills available. She should continue on Vitamin D rx or does she need a lab draw since she has taken Vitamin D rx for 5 months?

## 2013-12-16 NOTE — Telephone Encounter (Signed)
Call to patient, advised of Karen Avila's instruction. She has Vit D refills available and will continue once weekly till appt with Dr Quincy Simmonds. Will send new dose of Effexor to pharm and patient to see how this works and discuss at [employer]. Patient agreeable.  Routing to provider for final review. Patient agreeable to disposition. Will close encounter

## 2014-02-17 ENCOUNTER — Ambulatory Visit (INDEPENDENT_AMBULATORY_CARE_PROVIDER_SITE_OTHER): Payer: BC Managed Care – PPO | Admitting: Obstetrics and Gynecology

## 2014-02-17 ENCOUNTER — Encounter: Payer: Self-pay | Admitting: Obstetrics and Gynecology

## 2014-02-17 VITALS — BP 118/76 | HR 64 | Ht 61.5 in | Wt 152.6 lb

## 2014-02-17 DIAGNOSIS — Z01419 Encounter for gynecological examination (general) (routine) without abnormal findings: Secondary | ICD-10-CM

## 2014-02-17 DIAGNOSIS — E559 Vitamin D deficiency, unspecified: Secondary | ICD-10-CM

## 2014-02-17 DIAGNOSIS — Z113 Encounter for screening for infections with a predominantly sexual mode of transmission: Secondary | ICD-10-CM

## 2014-02-17 DIAGNOSIS — Z0000 Encounter for general adult medical examination without abnormal findings: Secondary | ICD-10-CM

## 2014-02-17 DIAGNOSIS — N951 Menopausal and female climacteric states: Secondary | ICD-10-CM

## 2014-02-17 DIAGNOSIS — E785 Hyperlipidemia, unspecified: Secondary | ICD-10-CM

## 2014-02-17 LAB — POCT URINALYSIS DIPSTICK
BILIRUBIN UA: NEGATIVE
Blood, UA: NEGATIVE
GLUCOSE UA: NEGATIVE
KETONES UA: NEGATIVE
LEUKOCYTES UA: NEGATIVE
Nitrite, UA: NEGATIVE
Protein, UA: NEGATIVE
Urobilinogen, UA: NEGATIVE
pH, UA: 5

## 2014-02-17 LAB — CBC
HCT: 34.4 % — ABNORMAL LOW (ref 36.0–46.0)
Hemoglobin: 11.7 g/dL — ABNORMAL LOW (ref 12.0–15.0)
MCH: 27 pg (ref 26.0–34.0)
MCHC: 34 g/dL (ref 30.0–36.0)
MCV: 79.3 fL (ref 78.0–100.0)
PLATELETS: 402 10*3/uL — AB (ref 150–400)
RBC: 4.34 MIL/uL (ref 3.87–5.11)
RDW: 13.8 % (ref 11.5–15.5)
WBC: 8.7 10*3/uL (ref 4.0–10.5)

## 2014-02-17 LAB — HEMOGLOBIN, FINGERSTICK: HEMOGLOBIN, FINGERSTICK: 12.1 g/dL (ref 12.0–16.0)

## 2014-02-17 MED ORDER — VITAMIN D (ERGOCALCIFEROL) 1.25 MG (50000 UNIT) PO CAPS: 50000.0000 [IU] | ORAL_CAPSULE | ORAL | Status: DC

## 2014-02-17 MED ORDER — VENLAFAXINE HCL ER 75 MG PO CP24: 75.0000 mg | ORAL_CAPSULE | Freq: Every day | ORAL | Status: DC

## 2014-02-17 NOTE — Addendum Note (Signed)
Addended by: Tacy Learn, Yahia Bottger E on: 02/17/2014 05:07 PM   Modules accepted: Orders

## 2014-02-17 NOTE — Progress Notes (Signed)
Patient ID: Karen Avila, female   DOB: 1971/06/07, 43 y.o.   MRN: [id number] GYNECOLOGY VISIT  PCP:  Elisha Headland, MD Jule Ser, [place])  Referring provider:   HPI: 43 y.o.   Divorced  Caucasian  female   [phone] with Patient's last menstrual period was 01/17/2014.   here for  AEX.  Every other month has a menses - light to moderate flow, lasting 4 -5 days. Decreased cramping since embolization.   Notes a vulvar lump.  Not painful.  No increasing in size.  Not draining.   Patient is on Effexor 75 mg to control hot flashes from [address]. On vitamin D 50,000 IU weekly for vit D level of 14.   Increased energy.  Sleeping better.  No FSH level.  TSH 0.836.  Wants cholesterol recheck and STD testing. Had lost condom.  Family history of diabetes.  Hgb:    12.1 Urine:  Neg  GYNECOLOGIC HISTORY: Patient's last menstrual period was 01/17/2014. Sexually active:  yes Partner preference: female Contraception:  condoms  Menopausal hormone therapy: n/a DES exposure:  no  Blood transfusions:  no Sexually transmitted diseases:   no GYN procedures and prior surgeries:  Hx of cryotherapy to cervix 1996. Last mammogram:  03-11-2013 calcifications in right breast: [facility]Rad 2:  [facility]   (history of left breast biopsy 2012 - fibrocystic change)            Last pap and high risk HPV testing:  02-10-13 wnl:neg HR HPV History of abnormal pap smear:  Hx cryotherapy of cervix 1996.  Paps normal since.   OB History   Grav Para Term Preterm Abortions TAB SAB Ect Mult Living   4 1 1  3  3   1        LIFESTYLE: Exercise: no              Tobacco: no Alcohol:   1-2 glasses of wine per week Drug use:  no  OTHER HEALTH MAINTENANCE: Tetanus/TDap:  2013 Gardisil:              n/a Influenza:            08/2013 Zostavax:            n/a  Bone density:      n/a Colonoscopy:      n/a  Cholesterol check:  Borderline in 2014  Family History  Problem Relation Age of Onset   . Osteoarthritis Mother   . Asthma Mother   . Cancer Mother     skin cancer  . Hyperlipidemia Mother   . Hypertension Mother   . Migraines Mother   . Stroke Mother   . Hyperlipidemia Father   . Hypertension Father   . Diabetes Father 30    pre diabetic  . Diabetes Sister 48    now insukin pump  . Breast cancer Maternal Grandmother   . Heart failure Maternal Grandmother     Patient Active Problem List   Diagnosis Date Noted  . EXUDATIVE PHARYNGITIS 07/19/2008   Past Medical History  Diagnosis Date  . Fibroid 2000  . Abnormal uterine bleeding 2000    fibroids  . Anemia     past h/o anemia  . 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 Migraines   . Migraine, unspecified migraine type     --complex    Past Surgical History  Procedure Laterality Date  . Appendectomy  1977  . Breast surgery Left 2000    lumpectomy  . Gynecologic cryosurgery  1996  . Uterine artery embolization  2011  . Papilloma removal of left breast Left     ALLERGIES: Tramadol  Current Outpatient Prescriptions  Medication Sig Dispense Refill  . Acetaminophen (TYLENOL PO) Take by mouth as needed.      . metroNIDAZOLE (METROGEL) 1 % gel Apply topically daily.  45 g  0  . SUMAtriptan (IMITREX) 25 MG tablet Take 25 mg by mouth every 2 (two) hours as needed for migraine or headache. May repeat in 2 hours if headache persists or recurs.      . venlafaxine XR (EFFEXOR XR) 75 MG 24 hr capsule Take 1 capsule (75 mg total) by mouth daily with breakfast.  30 capsule  2  . Vitamin D, Ergocalciferol, (DRISDOL) 50000 UNITS CAPS capsule Take 1 capsule (50,000 Units total) by mouth every 7 (seven) days.  30 capsule  1   No current facility-administered medications for this visit.     ROS:  Pertinent items are noted in HPI.  SOCIAL HISTORY:  Divorced.  Ultrasound technician for [employer]  PHYSICAL  EXAMINATION:    BP 118/76  Pulse 64  Ht 5' 1.5" (1.562 m)  Wt 152 lb 9.6 oz (69.219 kg)  BMI 28.37 kg/m2  LMP 01/17/2014   Wt Readings from Last 3 Encounters:  02/17/14 152 lb 9.6 oz (69.219 kg)  09/30/13 156 lb (70.761 kg)  02/10/13 151 lb (68.493 kg)     Ht Readings from Last 3 Encounters:  02/17/14 5' 1.5" (1.562 m)  02/10/13 5' 1.5" (1.562 m)  01/21/13 5\' 2"  (1.575 m)    General appearance: alert, cooperative and appears stated age Head: Normocephalic, without obvious abnormality, atraumatic Neck: no adenopathy, supple, symmetrical, trachea midline and thyroid not enlarged, symmetric, no tenderness/mass/nodules Lungs: clear to auscultation bilaterally Breasts: Inspection negative, No nipple retraction or dimpling, No nipple discharge or bleeding, No axillary or supraclavicular adenopathy, Normal to palpation without dominant masses Heart: regular rate and rhythm Abdomen: soft, non-tender; no masses,  no organomegaly Extremities: extremities normal, atraumatic, no cyanosis or edema Skin: Skin color, texture, turgor normal. No rashes or lesions Lymph nodes: Cervical, supraclavicular, and axillary nodes normal. No abnormal inguinal nodes palpated Neurologic: Grossly normal  Pelvic: External genitalia:  no lesions.               Urethra:  normal appearing urethra with no masses, tenderness or lesions              Bartholins and Skenes: normal                 Vagina: normal appearing vagina with normal color and discharge, no lesions              Cervix: normal appearance              Pap and high risk HPV testing done: yes.            Bimanual Exam:  Uterus:  uterus is 6 week size, shape, consistency and nontender                                      Adnexa: normal adnexa in size, nontender and no masses  Rectovaginal: Confirms                                      Anus:  normal sphincter tone, no lesions  ASSESSMENT  Normal gynecologic  exam. Status post uterine artery embolization for symptomatic fibroids.  History of cervical cryotherapy. Menopausal symptoms.  Treated with Effexor. Vit D deficiency.  Hyperlipidemia.  Family history of diabetes.  Desire for STD testing.   PLAN  Mammogram recommended yearly.  Pap smear and high risk HPV testing performed.  Lipid profile, CMP, CBC, FSH, Vit D. HIV, RPR, Hep B, Hep C, GC/CT.  Rx to continue with Effexor 75 mg daily.  90.  RF 3.  Vit D 50,000 IU weekly.  #30.  RF one.  (Will reassess after vit D level is back.) Counseled on self breast exam, Calcium and vitamin D intake, exercise. Return annually or prn   An After Visit Summary was printed and given to the patient.

## 2014-02-17 NOTE — Patient Instructions (Signed)

## 2014-02-18 LAB — LIPID PANEL
Cholesterol: 235 mg/dL — ABNORMAL HIGH (ref 0–200)
HDL: 53 mg/dL (ref >39)
LDL Cholesterol: 120 mg/dL — ABNORMAL HIGH (ref 0–99)
TRIGLYCERIDES: 310 mg/dL — AB (ref <150)
Total CHOL/HDL Ratio: 4.4 Ratio
VLDL: 62 mg/dL — ABNORMAL HIGH (ref 0–40)

## 2014-02-18 LAB — GC/CHLAMYDIA PROBE AMP, URINE
Chlamydia, Swab/Urine, PCR: NEGATIVE
GC Probe Amp, Urine: NEGATIVE

## 2014-02-18 LAB — COMPREHENSIVE METABOLIC PANEL
ALBUMIN: 4.3 g/dL (ref 3.5–5.2)
ALK PHOS: 66 U/L (ref 39–117)
ALT: 15 U/L (ref 0–35)
AST: 14 U/L (ref 0–37)
BUN: 10 mg/dL (ref 6–23)
CO2: 23 mEq/L (ref 19–32)
Calcium: 9.2 mg/dL (ref 8.4–10.5)
Chloride: 100 mEq/L (ref 96–112)
Creat: 0.69 mg/dL (ref 0.50–1.10)
GLUCOSE: 84 mg/dL (ref 70–99)
POTASSIUM: 3.9 meq/L (ref 3.5–5.3)
Sodium: 135 mEq/L (ref 135–145)
TOTAL PROTEIN: 7 g/dL (ref 6.0–8.3)
Total Bilirubin: 0.3 mg/dL (ref 0.2–1.2)

## 2014-02-18 LAB — HEPATITIS C ANTIBODY: HCV Ab: NEGATIVE

## 2014-02-18 LAB — STD PANEL
HIV: NONREACTIVE
Hepatitis B Surface Ag: NEGATIVE

## 2014-02-18 LAB — FOLLICLE STIMULATING HORMONE: FSH: 13.5 m[IU]/mL

## 2014-02-18 LAB — HEMOGLOBIN A1C
Hgb A1c MFr Bld: 5.5 % (ref <5.7)
Mean Plasma Glucose: 111 mg/dL (ref <117)

## 2014-02-18 LAB — VITAMIN D 25 HYDROXY (VIT D DEFICIENCY, FRACTURES): VIT D 25 HYDROXY: 58 ng/mL (ref 30–89)

## 2014-02-19 ENCOUNTER — Encounter: Payer: Self-pay | Admitting: Obstetrics and Gynecology

## 2014-02-22 LAB — IPS PAP TEST WITH HPV

## 2014-09-11 ENCOUNTER — Encounter: Payer: Self-pay | Admitting: Obstetrics and Gynecology

## 2015-02-23 ENCOUNTER — Ambulatory Visit: Payer: BC Managed Care – PPO | Admitting: Obstetrics and Gynecology

## 2015-02-26 ENCOUNTER — Other Ambulatory Visit: Payer: Self-pay | Admitting: Obstetrics and Gynecology

## 2015-02-26 MED ORDER — VENLAFAXINE HCL ER 75 MG PO CP24: 75.0000 mg | ORAL_CAPSULE | Freq: Every day | ORAL | Status: DC

## 2015-02-26 NOTE — Telephone Encounter (Signed)
Medication refill request: Effexor  Last AEX:  02/17/14 Dr. Quincy Simmonds Next AEX: 03/30/15 Dr. Quincy Simmonds Last MMG (if hormonal medication request): 03/11/13 diagnostic bilateral BIRADS2:Benign  Refill authorized: 02/17/14 #90caps/ 3 refill. Today please advise.

## 2015-02-26 NOTE — Telephone Encounter (Signed)
Pt requests refill for effexor. States it is "working great!" Pt has enough until the end of this week. Pt scheduled for aex with dr Quincy Simmonds 03/30/15.  [facility]

## 2015-03-26 ENCOUNTER — Other Ambulatory Visit: Payer: Self-pay | Admitting: Obstetrics and Gynecology

## 2015-03-26 MED ORDER — VENLAFAXINE HCL ER 75 MG PO CP24: 75.0000 mg | ORAL_CAPSULE | Freq: Every day | ORAL | Status: DC

## 2015-03-26 NOTE — Telephone Encounter (Signed)
Medication refill request: Effexor  Last AEX:  02/17/14 Dr. Quincy Simmonds  Next AEX: 05/21/15 Dr. Quincy Simmonds Last MMG (if hormonal medication request): 03/11/13 BIRADs2:Benign  Refill authorized: 02/26/15 #30caps w/ 0R. Today please advise.

## 2015-03-26 NOTE — Telephone Encounter (Signed)
Pt needing refill on effexor sent to [facility] in [place] at [phone].

## 2015-03-30 ENCOUNTER — Ambulatory Visit: Payer: Self-pay | Admitting: Obstetrics and Gynecology

## 2015-04-04 ENCOUNTER — Ambulatory Visit (INDEPENDENT_AMBULATORY_CARE_PROVIDER_SITE_OTHER): Payer: Self-pay | Admitting: Obstetrics and Gynecology

## 2015-04-04 ENCOUNTER — Encounter: Payer: Self-pay | Admitting: Obstetrics and Gynecology

## 2015-04-04 VITALS — BP 118/80 | HR 60 | Resp 14 | Ht 61.0 in | Wt 147.0 lb

## 2015-04-04 DIAGNOSIS — Z01419 Encounter for gynecological examination (general) (routine) without abnormal findings: Secondary | ICD-10-CM

## 2015-04-04 DIAGNOSIS — Z124 Encounter for screening for malignant neoplasm of cervix: Secondary | ICD-10-CM

## 2015-04-04 MED ORDER — VENLAFAXINE HCL ER 75 MG PO CP24: 75.0000 mg | ORAL_CAPSULE | Freq: Every day | ORAL | Status: DC

## 2015-04-04 NOTE — Progress Notes (Signed)
Patient ID: Karen Avila, female   DOB: 02/04/1971, 44 y.o.   MRN: [id number] 44 y.o. [id number] Divorced Caucasian female here for annual exam.    Has had only 3 menses since last annual exam.  Light menses.   Taking Effexor for menopausal symptoms.  Feels happier as well.  Feels warmer but not having terrible hot flashes.   Went to [place] last year.   Now working weekends at [employer] and also working for [employer].  Back in school getting her bachelor's degree.   PCP:  Elisha Headland, MD   Patient's last menstrual period was 03/14/2015.          Sexually active: Yes.    The current method of family planning is condoms most of the time.    Exercising: No.  The patient does not participate in regular exercise at present. Smoker:  no  Health Maintenance: Pap:  02-17-14 wnl:neg HR HPV History of abnormal Pap:  Yes, Hx of cryotherapy to cervix 1996. MMG:  03-11-13 Bi Rad 2 Diag Bil  Dense benign calcifications bilaterally.  No evidence of malignancy--screening [facility].  Patient will make an appointment at [facility]. Colonoscopy:  n/a BMD:  Never TDaP:  2013 Screening Labs:  Hb today: will come back for labs once she has insurance, Urine today:    reports that she has never smoked. She has never used smokeless tobacco. She reports that she drinks about 1.0 oz of alcohol per week. She reports that she does not use illicit drugs.  Past Medical History  Diagnosis Date  . Fibroid 2000  . Abnormal uterine bleeding 2000    fibroids  . Anemia     past h/o anemia  . 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 Migraines   . Migraine, unspecified migraine type     --complex    Past Surgical History  Procedure Laterality Date  . Appendectomy  1977  . Breast surgery Left 2000    lumpectomy  . Gynecologic cryosurgery  1996  . Uterine artery embolization  2011  .  Papilloma removal of left breast Left     Current Outpatient Prescriptions  Medication Sig Dispense Refill  . Acetaminophen (TYLENOL PO) Take by mouth as needed.    Marland Kitchen albuterol (PROVENTIL HFA;VENTOLIN HFA) 108 (90 BASE) MCG/ACT inhaler Inhale 2 puffs into the lungs every 4 (four) hours as needed for wheezing or shortness of breath.    . SUMAtriptan (IMITREX) 25 MG tablet Take 25 mg by mouth every 2 (two) hours as needed for migraine or headache. May repeat in 2 hours if headache persists or recurs.    . venlafaxine XR (EFFEXOR XR) 75 MG 24 hr capsule Take 1 capsule (75 mg total) by mouth daily with breakfast. 30 capsule 2   No current facility-administered medications for this visit.    Family History  Problem Relation Age of Onset  . Osteoarthritis Mother   . Asthma Mother   . Cancer Mother     skin cancer  . Hyperlipidemia Mother   . Hypertension Mother   . Migraines Mother   . Stroke Mother   . Hyperlipidemia Father   . Hypertension Father   . Diabetes Father 36    pre diabetic  . Diabetes Sister 11    now insukin pump  . Breast cancer Maternal Grandmother   . Heart  failure Maternal Grandmother     ROS:  Pertinent items are noted in HPI.  Otherwise, a comprehensive ROS was negative.  Exam:   BP 118/80 mmHg  Pulse 60  Resp 14  Ht 5\' 1"  (1.549 m)  Wt 147 lb (66.679 kg)  BMI 27.79 kg/m2  LMP 03/14/2015    General appearance: alert, cooperative and appears stated age Head: Normocephalic, without obvious abnormality, atraumatic Neck: no adenopathy, supple, symmetrical, trachea midline and thyroid normal to inspection and palpation Lungs: clear to auscultation bilaterally Breasts: normal appearance, no masses or tenderness, Inspection negative, No nipple retraction or dimpling, No nipple discharge or bleeding, No axillary or supraclavicular adenopathy Heart: regular rate and rhythm Abdomen: soft, non-tender; bowel sounds normal; no masses,  no organomegaly Extremities:  extremities normal, atraumatic, no cyanosis or edema Skin: Skin color, texture, turgor normal. No rashes or lesions Lymph nodes: Cervical, supraclavicular, and axillary nodes normal. No abnormal inguinal nodes palpated Neurologic: Grossly normal  Pelvic: External genitalia:  no lesions              Urethra:  normal appearing urethra with no masses, tenderness or lesions              Bartholins and Skenes: normal                 Vagina: normal appearing vagina with normal color and discharge, no lesions              Cervix: no lesions              Pap taken: Yes.   Bimanual Exam:  Uterus:  enlarged,  8 weeks size              Adnexa: no mass, fullness, tenderness              Rectovaginal: Yes.  .  Confirms.              Anus:  normal sphincter tone, no lesions  Chaperone was present for exam.  Assessment:   Well woman visit with normal exam. Hx cryotherapy to cervix.  Status post uterine artery embolization.  Perimenopausal female. On Effexor for hot flashes.  Plan: Yearly mammogram recommended after age 81.  Recommended self breast exam.  Pap and HR HPV as above. Discussed Calcium, Vitamin D, regular exercise program including cardiovascular and weight bearing exercise. Labs performed.  No..   See orders.  Will return for labs to be drawn.  Refills given on medications.  Yes.  .  See orders.  Effexor XR. Follow up annually and prn.      After visit summary provided.

## 2015-04-04 NOTE — Patient Instructions (Signed)

## 2015-04-05 DIAGNOSIS — R87619 Unspecified abnormal cytological findings in specimens from cervix uteri: Secondary | ICD-10-CM

## 2015-04-05 HISTORY — DX: Unspecified abnormal cytological findings in specimens from cervix uteri: R87.619

## 2015-04-10 LAB — IPS PAP TEST WITH HPV

## 2015-04-17 ENCOUNTER — Telehealth: Payer: Self-pay | Admitting: Emergency Medicine

## 2015-04-17 NOTE — Telephone Encounter (Signed)
-----   Message from Nunzio Cobbs, MD sent at 04/12/2015  5:55 PM EDT ----- Please inform patient of LGSIL pap and negative HR HPV testing.  Due to the negative HR HPV testing, she will need to have her next pap and HPV test in one year. Please enter 08 recall.   Cc- Marisa Sprinkles

## 2015-04-17 NOTE — Telephone Encounter (Signed)
Message left to return call to Marvin at [phone].   08 Recall entered

## 2015-04-17 NOTE — Telephone Encounter (Signed)
Patient returned call and message from Dr. Quincy Simmonds given. Questions from patient answered and patient verbalized understanding of importance of follow up in one year. Patient already has annual exam scheduled. 08 recall in place. Routing to provider for final review. Patient agreeable to disposition. Will close encounter.

## 2015-05-01 ENCOUNTER — Ambulatory Visit: Payer: Self-pay | Admitting: Nurse Practitioner

## 2015-05-21 ENCOUNTER — Ambulatory Visit: Payer: Self-pay | Admitting: Obstetrics and Gynecology

## 2015-05-25 ENCOUNTER — Other Ambulatory Visit (INDEPENDENT_AMBULATORY_CARE_PROVIDER_SITE_OTHER): Payer: Commercial Managed Care - PPO

## 2015-05-25 DIAGNOSIS — Z01419 Encounter for gynecological examination (general) (routine) without abnormal findings: Secondary | ICD-10-CM

## 2015-05-25 LAB — CBC
HEMATOCRIT: 35 % — AB (ref 36.0–46.0)
Hemoglobin: 11.8 g/dL — ABNORMAL LOW (ref 12.0–15.0)
MCH: 28 pg (ref 26.0–34.0)
MCHC: 33.7 g/dL (ref 30.0–36.0)
MCV: 82.9 fL (ref 78.0–100.0)
MPV: 8.1 fL — AB (ref 8.6–12.4)
Platelets: 390 10*3/uL (ref 150–400)
RBC: 4.22 MIL/uL (ref 3.87–5.11)
RDW: 13.5 % (ref 11.5–15.5)
WBC: 6.1 10*3/uL (ref 4.0–10.5)

## 2015-05-25 LAB — COMPREHENSIVE METABOLIC PANEL
ALT: 13 U/L (ref 0–35)
AST: 15 U/L (ref 0–37)
Albumin: 4.1 g/dL (ref 3.5–5.2)
Alkaline Phosphatase: 57 U/L (ref 39–117)
BUN: 7 mg/dL (ref 6–23)
CALCIUM: 8.6 mg/dL (ref 8.4–10.5)
CO2: 25 mEq/L (ref 19–32)
Chloride: 102 mEq/L (ref 96–112)
Creat: 0.59 mg/dL (ref 0.50–1.10)
Glucose, Bld: 95 mg/dL (ref 70–99)
Potassium: 4.2 mEq/L (ref 3.5–5.3)
Sodium: 136 mEq/L (ref 135–145)
Total Bilirubin: 0.3 mg/dL (ref 0.2–1.2)
Total Protein: 6.7 g/dL (ref 6.0–8.3)

## 2015-05-25 LAB — LIPID PANEL
CHOL/HDL RATIO: 3.6 ratio
Cholesterol: 208 mg/dL — ABNORMAL HIGH (ref 0–200)
HDL: 58 mg/dL (ref >=46)
LDL Cholesterol: 120 mg/dL — ABNORMAL HIGH (ref 0–99)
Triglycerides: 149 mg/dL (ref <150)
VLDL: 30 mg/dL (ref 0–40)

## 2015-05-25 LAB — IRON: Iron: 55 ug/dL (ref 42–145)

## 2015-06-06 ENCOUNTER — Telehealth: Payer: Self-pay | Admitting: *Deleted

## 2015-06-06 NOTE — Telephone Encounter (Signed)
Patient has questions about abnormal pap and micro angioma that she was diagnosed with a few days ago.

## 2015-06-06 NOTE — Telephone Encounter (Signed)
Annual exam 04-05-15 with Dr Quincy Simmonds. 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 Recent onset of increased skin moles and skin itching. Seen by dermatology and one mole removed, biopsy pending. Was told has micro hemangiomas several years ago. Has noticed increased fatigue, joint stiffness and sleep issues. Concerned all of this may be related. Read "white pages" online and symptoms match for "systemic cancer." Patient concerned about possible relation to abnormal pap. Does not feel PCP will listen. Advised needs office visit to discuss personally, not because of potential for cancer, but so that Dr Quincy Simmonds can advise.  Appointment scheduled for Monday 06-11-15 at 0830. Patient declined earlier appointment.  Routing to provider for final review. Patient agreeable to disposition. Will close encounter.

## 2015-06-11 ENCOUNTER — Encounter: Payer: Self-pay | Admitting: Obstetrics and Gynecology

## 2015-06-11 ENCOUNTER — Ambulatory Visit (INDEPENDENT_AMBULATORY_CARE_PROVIDER_SITE_OTHER): Payer: Commercial Managed Care - PPO | Admitting: Obstetrics and Gynecology

## 2015-06-11 VITALS — BP 122/82 | HR 76 | Ht 61.0 in | Wt 156.6 lb

## 2015-06-11 DIAGNOSIS — E559 Vitamin D deficiency, unspecified: Secondary | ICD-10-CM

## 2015-06-11 DIAGNOSIS — R87612 Low grade squamous intraepithelial lesion on cytologic smear of cervix (LGSIL): Secondary | ICD-10-CM | POA: Diagnosis not present

## 2015-06-11 DIAGNOSIS — R5383 Other fatigue: Secondary | ICD-10-CM | POA: Diagnosis not present

## 2015-06-11 HISTORY — DX: Vitamin D deficiency, unspecified: E55.9

## 2015-06-11 LAB — HEMOGLOBIN A1C
Hgb A1c MFr Bld: 5.5 % (ref <5.7)
Mean Plasma Glucose: 111 mg/dL (ref <117)

## 2015-06-11 LAB — VITAMIN B12: Vitamin B-12: 443 pg/mL (ref 211–911)

## 2015-06-11 NOTE — Progress Notes (Signed)
Patient ID: Karen Avila, female   DOB: 1971-03-23, 44 y.o.   MRN: [id number] GYNECOLOGY  VISIT   HPI: 44 y.o.   Divorced  Caucasian  female   [phone] with Patient's last menstrual period was 03/14/2015 (exact date).   here for consultation regarding abnormal pap smear   Also reporting increased fatigue, joint stiffness, and not sleeping well.  Also concerned about dermatologic issues.  Awaiting biopsy results.  Has new onset multiple cherry hemangiomas.  Concerned about malignancy or diabetes based on her literature review.  Comes in with system review list and concerned about diabetes, leukemia/lymphoma/myeloma, bromine toxicity, and B12 deficiency.  Not taking her vit d OTC regularly.  Not sleeping well due to cervical herniated disc and right arm numbness.  Sees chiropractor.   Pap 04/05/15 - LGSIL and negative HR HPV.  Hx of cryotherapy in 1996.   PCP: Elisha Headland, MD Jule Ser, [place])  GYNECOLOGIC HISTORY: Patient's last menstrual period was 03/14/2015 (exact date). Contraception: condoms Menopausal hormone therapy: n/a Last mammogram: 06-05-15 normal per pt[facility] Report received - BI-RADS1. Last pap smear: 04-05-15 LGSIL/Neg HR HPV        OB History    Gravida Para Term Preterm AB TAB SAB Ectopic Multiple Living   4 1 1  3  3   1          Patient Active Problem List   Diagnosis Date Noted  . EXUDATIVE PHARYNGITIS 07/19/2008    Past Medical History  Diagnosis Date  . Fibroid 2000  . Abnormal uterine bleeding 2000    fibroids  . Anemia     past h/o anemia  . 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 Migraines   . Migraine, unspecified migraine type     --complex  . Abnormal Pap smear of cervix 04-05-15    LGSIL/Neg HR HPV    Past Surgical History  Procedure Laterality Date  . Appendectomy  1977  . Breast surgery Left 2000    lumpectomy   . Gynecologic cryosurgery  1996  . Uterine artery embolization  2011  . Papilloma removal of left breast Left     Current Outpatient Prescriptions  Medication Sig Dispense Refill  . Acetaminophen (TYLENOL PO) Take by mouth as needed.    Marland Kitchen albuterol (PROVENTIL HFA;VENTOLIN HFA) 108 (90 BASE) MCG/ACT inhaler Inhale 2 puffs into the lungs every 4 (four) hours as needed for wheezing or shortness of breath.    . SUMAtriptan (IMITREX) 25 MG tablet Take 25 mg by mouth every 2 (two) hours as needed for migraine or headache. May repeat in 2 hours if headache persists or recurs.    . venlafaxine XR (EFFEXOR XR) 75 MG 24 hr capsule Take 1 capsule (75 mg total) by mouth daily with breakfast. 30 capsule 11   No current facility-administered medications for this visit.     ALLERGIES: Tramadol  Family History  Problem Relation Age of Onset  . Osteoarthritis Mother   . Asthma Mother   . Cancer Mother     skin cancer  . Hyperlipidemia Mother   . Hypertension Mother   . Migraines Mother   . Stroke Mother   . Hyperlipidemia Father   . Hypertension Father   . Diabetes Father 67    pre diabetic  . Diabetes Sister 37    now insukin pump  . Breast cancer Maternal  Grandmother   . Heart failure Maternal Grandmother     History   Social History  . Marital Status: Divorced    Spouse Name: N/A  . Number of Children: N/A  . Years of Education: N/A   Occupational History  . Not on file.   Social History Main Topics  . Smoking status: Never Smoker   . Smokeless tobacco: Never Used  . Alcohol Use: 1.2 oz/week    2 Standard drinks or equivalent per week     Comment: occ wine  . Drug Use: No  . Sexual Activity:    Partners: Male    Birth Control/ Protection: Condom   Other Topics Concern  . Not on file   Social History Narrative    ROS:  Pertinent items are noted in HPI.  PHYSICAL EXAMINATION:    BP 122/82 mmHg  Pulse 76  Ht 5\' 1"  (1.549 m)  Wt 156 lb 9.6 oz (71.033 kg)  BMI  29.60 kg/m2  LMP 03/14/2015 (Exact Date)    General appearance: alert, cooperative and appears stated age  ASSESSMENT    LGSIL pap. Cherry hemangioma.  Fatigue. Multiple positive review of systems.  See list presented that will be scanned in.  Hx of low Vit D.  PLAN  Counseled regarding LGSIL, HPV, abnormal paps, and colposcopy. Will return for colposcopy.  Will check HgbA1C, Vit D, and Vit B12 level.  I encouraged patient to see her PCP regarding her multiple concerns in order to have her brought through a proper medical evaluation.    An After Visit Summary was printed and given to the patient.  _25____ minutes face to face time of which over 50% was spent in counseling.

## 2015-06-12 ENCOUNTER — Encounter: Payer: Self-pay | Admitting: Obstetrics and Gynecology

## 2015-06-12 ENCOUNTER — Other Ambulatory Visit: Payer: Self-pay | Admitting: Obstetrics and Gynecology

## 2015-06-12 DIAGNOSIS — E559 Vitamin D deficiency, unspecified: Secondary | ICD-10-CM

## 2015-06-12 LAB — VITAMIN D 25 HYDROXY (VIT D DEFICIENCY, FRACTURES): Vit D, 25-Hydroxy: 15 ng/mL — ABNORMAL LOW (ref 30–100)

## 2015-06-12 MED ORDER — VITAMIN D (ERGOCALCIFEROL) 1.25 MG (50000 UNIT) PO CAPS: 50000.0000 [IU] | ORAL_CAPSULE | ORAL | Status: DC

## 2015-06-26 ENCOUNTER — Telehealth: Payer: Self-pay | Admitting: Obstetrics and Gynecology

## 2015-06-26 NOTE — Telephone Encounter (Signed)
Patient called requesting benefits and scheduling for colposcopy. Reviewed information for benefits but patient aware not verified yet. Will do tomorrow morning. Patient agreeable and asks to proceed with scheduling. Transferred to sally.

## 2015-06-26 NOTE — Telephone Encounter (Signed)
Patient calls back, she is able to come to appointment tomorrow. Colpo now scheduled for 06-27-15 at 2pm with Dr Quincy Simmonds. Cycle day 12. States not sexually active. Is this agreeable?

## 2015-06-26 NOTE — Telephone Encounter (Signed)
[facility] for appointment for tomorrow.  Will do UPT prior to procedure.

## 2015-06-26 NOTE — Telephone Encounter (Signed)
LMP 06-16-15. Patient using condoms for contraception. States not sexually active for 3 months and no plans to be sexually active. States menstrual cycles are very irregular and may be 12 weeks before she has another one.  Offered appointment for tomorrow. Patient is unsure if she can get off work.  Appointment scheduled for 07-09-15 at 3pm. Patient is advised this date will be reviewed with Dr Quincy Simmonds due to date of LMP.  Patient will call back if she is able to get off work tomorrow and come in for appointment. Instructed to take Motrin 800mg  one hour prior with food.     Please advise.

## 2015-06-27 ENCOUNTER — Encounter: Payer: Self-pay | Admitting: Obstetrics and Gynecology

## 2015-06-27 ENCOUNTER — Ambulatory Visit (INDEPENDENT_AMBULATORY_CARE_PROVIDER_SITE_OTHER): Payer: Commercial Managed Care - PPO | Admitting: Obstetrics and Gynecology

## 2015-06-27 VITALS — BP 120/70 | HR 80 | Ht 61.0 in | Wt 157.6 lb

## 2015-06-27 DIAGNOSIS — R87612 Low grade squamous intraepithelial lesion on cytologic smear of cervix (LGSIL): Secondary | ICD-10-CM

## 2015-06-27 DIAGNOSIS — Z01812 Encounter for preprocedural laboratory examination: Secondary | ICD-10-CM

## 2015-06-27 LAB — POCT URINE PREGNANCY: PREG TEST UR: NEGATIVE

## 2015-06-27 NOTE — Telephone Encounter (Signed)
Estill Bamberg notified of need for UPT prior to procedure. Encounter closed.

## 2015-06-27 NOTE — Progress Notes (Signed)
Subjective:     Patient ID: LELAR FAREWELL, female   DOB: October 11, 1971, 44 y.o.   MRN: [id number]  HPI  Patient is here today for colposcopy.  Pap 04-05-15 LGSIL:Neg HR HPV.  History of cryotherapy to cervix in 1996.  Abstinence for 3 months.   Feels better on vitamin D Rx.  Review of Systems  LMP:  06-16-15 Contraception:  Condoms Urine UPT: Negative     Objective:   Physical Exam  Genitourinary:     Colposcopy.  Consent for procedure.  Speculum placed in vagina.  3% acetic acid placed in the vagina.  Colposcopy satisfactory.  General friability of the exocervix.  No acetowhite lesions seen.  ECC taken and sent to pathology.  Biopsy of 4:00 ulcerated area sent to pathology.  Monsel's placed.  Minimal EBL.  No complications.     Assessment:     LGSIL pap.  Negative HR HPV.  Hx. Cryotherapy to cervix.  Vit D deficiency.  On replacement.     Plan:     Follow up biopsies.  Anticipate cotesting in one year.  Instructions and precautions given.  Follow up vit D level after 3 months of treatment.   After visit summary to patient.

## 2015-06-27 NOTE — Patient Instructions (Signed)

## 2015-06-29 LAB — IPS OTHER TISSUE BIOPSY

## 2015-07-02 ENCOUNTER — Telehealth: Payer: Self-pay | Admitting: Obstetrics and Gynecology

## 2015-07-02 NOTE — Telephone Encounter (Signed)
Spoke with patient. Advised of results as seen below from [place]. Patient is agreeable and verbalizes understanding. Aex scheduled for 04/05/2015 with Dr.Silva. 08 recall entered.  Routing to provider for final review. Patient agreeable to disposition. Will close encounter.   Patient aware provider will review message and nurse will return call if any additional advice or change of disposition.

## 2015-07-02 NOTE — Telephone Encounter (Signed)
Patient calling for results.

## 2015-07-02 NOTE — Telephone Encounter (Signed)
Encounter closed in error.   Notes Recorded by Nunzio Cobbs, MD on 07/01/2015 at 11:27 AM Please inform patient of colpo results: ECC benign and biopsy CIN I.  Needs cotesting in 1 year.  Please enter recall - 08.

## 2015-07-09 ENCOUNTER — Ambulatory Visit: Payer: Commercial Managed Care - PPO | Admitting: Obstetrics and Gynecology

## 2015-10-11 HISTORY — PX: CHOLECYSTECTOMY: SHX55

## 2015-10-19 ENCOUNTER — Telehealth: Payer: Self-pay

## 2015-10-19 NOTE — Telephone Encounter (Signed)
-----   Message from Nunzio Cobbs, MD sent at 10/17/2015  1:37 PM EST ----- Regarding: please call to remind of need for lab visit if not already scheduled Please contact patient to schedule vit D lab recheck.  This popped up in my in box.   Thanks,   Brook  ----- Message -----    From: SYSTEM    Sent: 10/17/2015  12:04 AM      To: Nunzio Cobbs, MD

## 2015-10-19 NOTE — Telephone Encounter (Signed)
Spoke with patient. Patient is agreeable to schedule follow up lab appointment for Vitamin D level. Appointment scheduled for 10/22/2015 at 10 am. Order was previously placed.  Routing to provider for final review. Patient agreeable to disposition. Will close encounter.

## 2015-10-22 ENCOUNTER — Other Ambulatory Visit (INDEPENDENT_AMBULATORY_CARE_PROVIDER_SITE_OTHER): Payer: Commercial Managed Care - PPO

## 2015-10-22 DIAGNOSIS — E559 Vitamin D deficiency, unspecified: Secondary | ICD-10-CM

## 2015-10-23 LAB — VITAMIN D 25 HYDROXY (VIT D DEFICIENCY, FRACTURES): Vit D, 25-Hydroxy: 28 ng/mL — ABNORMAL LOW (ref 30–100)

## 2016-03-28 ENCOUNTER — Telehealth: Payer: Self-pay | Admitting: Emergency Medicine

## 2016-03-28 ENCOUNTER — Encounter: Payer: Self-pay | Admitting: Obstetrics and Gynecology

## 2016-03-28 NOTE — Telephone Encounter (Signed)
Chief Complaint  Patient presents with  . Advice Only    Lab work request   ===View-only below this line===   ----- Message -----    From: Santina Evans    Sent: 03/28/2016 11:12 AM EDT      To: Arloa Koh, MD Subject: Non-Urgent Medical Question  Dr Quincy Simmonds,  I have my annual appt next week. You usually check my cholesterol, iron, etc. I would like to have my A1c checked as well. Is there any prep I need to follow?  Caren Griffins

## 2016-03-28 NOTE — Telephone Encounter (Addendum)
Dr. Quincy Simmonds,  I have responded to the patient via mychart. Her appointment is at 3:45 for annual exam. I have offered her to have screening blood work while fasting if she chooses to before appointment. Mychart message as below. Will close encounter.        Great! See you next Friday!         ----- Message -----   From: Nurse Kallie Edward   Sent: 03/28/16 12:29 PM   To: Santina Evans   Subject: RE: RE: Non-Urgent Medical Question      Water is okay to have and arrive at 3:30 for your 3:45 appointment will allow plenty of time for check in. Estill Bamberg will take you to the lab for a blood draw. We will see you then!       ----- Message -----    From: Woodlief,Cristian W    Sent: 03/28/2016 11:45 AM EDT     To: Arloa Koh, MD   Subject: RE: RE: Non-Urgent Medical Question      NPO isn't a problem for me. Can I have water to plump up my vessels?   I pick my son up from school at 2:15, so I will be there between 3:15-3:30. If that doesn't allow enough time for labs, let me know.      ----- Message -----   From: Nurse Kallie Edward   Sent: 03/28/16 11:34 AM   To: Santina Evans   Subject: RE: Non-Urgent Medical Question      Ms Yarish,       For your lab work you will need to be fasting for cholesterol testing only. No additional preparation is needed for hemoglobin A1C and it can be drawn at any time.    If you would like to come earlier in the day to have your blood drawn before your appointment so that you do not have to be fasting all day, you can, we will just need to schedule a time with the lab technician.       ----- Message -----    From: Santina Evans    Sent: 03/28/2016 11:12 AM EDT     To: Arloa Koh, MD   Subject: Non-Urgent Medical Question      Dr Quincy Simmonds,      I have my annual appt next week. You usually check my cholesterol, iron, etc. I would like to have my  A1c checked as well. Is there any prep I need to follow?      Karen Avila

## 2016-03-28 NOTE — Telephone Encounter (Signed)
Responded to patient via mychart

## 2016-04-04 ENCOUNTER — Ambulatory Visit (INDEPENDENT_AMBULATORY_CARE_PROVIDER_SITE_OTHER): Payer: Commercial Managed Care - PPO | Admitting: Obstetrics and Gynecology

## 2016-04-04 ENCOUNTER — Ambulatory Visit: Payer: Self-pay | Admitting: Obstetrics and Gynecology

## 2016-04-04 ENCOUNTER — Encounter: Payer: Self-pay | Admitting: Obstetrics and Gynecology

## 2016-04-04 VITALS — BP 110/70 | HR 76 | Resp 18 | Ht 61.0 in | Wt 162.4 lb

## 2016-04-04 DIAGNOSIS — E559 Vitamin D deficiency, unspecified: Secondary | ICD-10-CM | POA: Diagnosis not present

## 2016-04-04 DIAGNOSIS — Z0000 Encounter for general adult medical examination without abnormal findings: Secondary | ICD-10-CM

## 2016-04-04 DIAGNOSIS — Z01419 Encounter for gynecological examination (general) (routine) without abnormal findings: Secondary | ICD-10-CM | POA: Diagnosis not present

## 2016-04-04 DIAGNOSIS — Z113 Encounter for screening for infections with a predominantly sexual mode of transmission: Secondary | ICD-10-CM | POA: Diagnosis not present

## 2016-04-04 DIAGNOSIS — N951 Menopausal and female climacteric states: Secondary | ICD-10-CM

## 2016-04-04 LAB — HEMOGLOBIN A1C
Hgb A1c MFr Bld: 5.5 % (ref <5.7)
MEAN PLASMA GLUCOSE: 111 mg/dL

## 2016-04-04 LAB — CBC
HEMATOCRIT: 35.1 % (ref 35.0–45.0)
HEMOGLOBIN: 11.7 g/dL (ref 11.7–15.5)
MCH: 27.5 pg (ref 27.0–33.0)
MCHC: 33.3 g/dL (ref 32.0–36.0)
MCV: 82.6 fL (ref 80.0–100.0)
MPV: 8.4 fL (ref 7.5–12.5)
Platelets: 396 10*3/uL (ref 140–400)
RBC: 4.25 MIL/uL (ref 3.80–5.10)
RDW: 13.6 % (ref 11.0–15.0)
WBC: 6.9 10*3/uL (ref 3.8–10.8)

## 2016-04-04 LAB — COMPREHENSIVE METABOLIC PANEL
ALT: 13 U/L (ref 6–29)
AST: 13 U/L (ref 10–35)
Albumin: 4.4 g/dL (ref 3.6–5.1)
Alkaline Phosphatase: 62 U/L (ref 33–115)
BUN: 12 mg/dL (ref 7–25)
CHLORIDE: 101 mmol/L (ref 98–110)
CO2: 26 mmol/L (ref 20–31)
CREATININE: 0.7 mg/dL (ref 0.50–1.10)
Calcium: 9.3 mg/dL (ref 8.6–10.2)
Glucose, Bld: 92 mg/dL (ref 65–99)
POTASSIUM: 4.4 mmol/L (ref 3.5–5.3)
SODIUM: 137 mmol/L (ref 135–146)
TOTAL PROTEIN: 6.9 g/dL (ref 6.1–8.1)
Total Bilirubin: 0.3 mg/dL (ref 0.2–1.2)

## 2016-04-04 LAB — FOLLICLE STIMULATING HORMONE: FSH: 46.6 m[IU]/mL

## 2016-04-04 LAB — POCT URINALYSIS DIPSTICK
BILIRUBIN UA: NEGATIVE
GLUCOSE UA: NEGATIVE
KETONES UA: NEGATIVE
Leukocytes, UA: NEGATIVE
Nitrite, UA: NEGATIVE
Protein, UA: NEGATIVE
RBC UA: NEGATIVE
UROBILINOGEN UA: NEGATIVE
pH, UA: 5

## 2016-04-04 LAB — LIPID PANEL
CHOL/HDL RATIO: 3 ratio (ref <=5.0)
CHOLESTEROL: 201 mg/dL — AB (ref 125–200)
HDL: 66 mg/dL (ref >=46)
LDL CALC: 108 mg/dL (ref <130)
Triglycerides: 136 mg/dL (ref <150)
VLDL: 27 mg/dL (ref <30)

## 2016-04-04 LAB — TSH: TSH: 0.8 m[IU]/L

## 2016-04-04 LAB — ESTRADIOL: Estradiol: 50 pg/mL

## 2016-04-04 MED ORDER — VENLAFAXINE HCL ER 75 MG PO CP24: 75.0000 mg | ORAL_CAPSULE | Freq: Every day | ORAL | Status: DC

## 2016-04-04 NOTE — Progress Notes (Addendum)
Patient ID: Karen Avila, female   DOB: [birth year], 45 y.o.   MRN: [id number] 45 y.o. [id number] Divorced Caucasian female here for annual exam.    Had robotic laparoscopic cholecystectomy last year.   Gained 22 pounds this year so far. Craving sweets and having increased thirst. No menses since December 2016.   Had 3 - 4 menses last year.  Hot flashes, come and go. Of Effexor for hot flashes.   Feels tired.   Feels like she does not heal as well with cutting legs with shaving.   Son is turning 18 this weekend.  PCP: Jamesetta Geralds, MD    Patient's last menstrual period was 11/04/2015 (exact date).     Period Cycle (Days):  (only 3-4 cycles per year) Period Pattern: (!) Irregular     Sexually active: Yes.   female The current method of family planning is condoms everytime.    Exercising: No.   Smoker:  no  Health Maintenance: Pap:  04-05-15 LGSIL:Neg HR HPV History of abnormal Pap:  Yes, 04-05-15 LGSIL:Neg HR HPV;colposcopy ECC--Neg, Cx Bx CIN I.  Hx cryotherapy to cervix 1996. MMG:  06-05-15 Density C/Neg/BiRads1:[facility] Colonoscopy:  n/a BMD:   n/a  Result  n/a TDaP:  2013 Gardasil:   N/A HIV: in pregnancy - negative. Hep C: NA Screening Labs:  Hb today: 11.7, Urine today: Neg   reports that she has never smoked. She has never used smokeless tobacco. She reports that she drinks alcohol. She reports that she does not use illicit drugs.  Past Medical History  Diagnosis Date  . Fibroid 2000  . Abnormal uterine bleeding 2000    fibroids  . Anemia     past h/o anemia  . 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 Migraines   . Migraine, unspecified migraine type     --complex  . Abnormal Pap smear of cervix 04-05-15    LGSIL/Neg HR HPV  . Vitamin D deficiency 06/11/15    Past Surgical History  Procedure Laterality Date  . Appendectomy  1977  . Breast  surgery Left 2000    lumpectomy  . Gynecologic cryosurgery  1996  . Uterine artery embolization  2011  . Papilloma removal of left breast Left   . Cholecystectomy  10/2015    --[facility]    Current Outpatient Prescriptions  Medication Sig Dispense Refill  . Acetaminophen (TYLENOL PO) Take by mouth as needed.    Marland Kitchen albuterol (PROVENTIL HFA;VENTOLIN HFA) 108 (90 BASE) MCG/ACT inhaler Inhale 2 puffs into the lungs every 4 (four) hours as needed for wheezing or shortness of breath.    . SUMAtriptan (IMITREX) 25 MG tablet Take 25 mg by mouth every 2 (two) hours as needed for migraine or headache. May repeat in 2 hours if headache persists or recurs.    . venlafaxine XR (EFFEXOR XR) 75 MG 24 hr capsule Take 1 capsule (75 mg total) by mouth daily with breakfast. 30 capsule 11   No current facility-administered medications for this visit.    Family History  Problem Relation Age of Onset  . Osteoarthritis Mother   . Asthma Mother   . Cancer Mother     skin cancer  . Hyperlipidemia Mother   . Hypertension Mother   . Migraines Mother   . Stroke Mother   . Hyperlipidemia Father   . Hypertension  Father   . Diabetes Father 108    pre diabetic  . Diabetes Sister 4    now insukin pump  . Breast cancer Maternal Grandmother   . Heart failure Maternal Grandmother     ROS:  Pertinent items are noted in HPI.  Otherwise, a comprehensive ROS was negative.  Exam:   BP 110/70 mmHg  Pulse 76  Resp 18  Ht 5\' 1"  (1.549 m)  Wt 162 lb 6.4 oz (73.664 kg)  BMI 30.70 kg/m2  LMP 11/04/2015 (Exact Date)    General appearance: alert, cooperative and appears stated age Head: Normocephalic, without obvious abnormality, atraumatic Neck: no adenopathy, supple, symmetrical, trachea midline and thyroid normal to inspection and palpation Lungs: clear to auscultation bilaterally Breasts: normal appearance, no masses or tenderness, Inspection negative, No nipple retraction or dimpling, No nipple  discharge or bleeding, No axillary or supraclavicular adenopathy, left areolar incision. Heart: regular rate and rhythm Abdomen: incisions:  Yes.     laparoscopic, soft, non-tender; no masses, no organomegaly Extremities: extremities normal, atraumatic, no cyanosis or edema Skin: Skin color, texture, turgor normal. No rashes or lesions Lymph nodes: Cervical, supraclavicular, and axillary nodes normal. No abnormal inguinal nodes palpated Neurologic: Grossly normal  Pelvic: External genitalia:  no lesions              Urethra:  normal appearing urethra with no masses, tenderness or lesions              Bartholins and Skenes: normal                 Vagina: normal appearing vagina with normal color and discharge, no lesions              Cervix: no lesions              Pap taken: Yes.   Bimanual Exam:  Uterus:  normal size, contour, position, consistency, mobility, non-tender              Adnexa: normal adnexa and no mass, fullness, tenderness              Rectal exam: Yes.  .  Confirms.              Anus:  normal sphincter tone, no lesions  Chaperone was present for exam.  Assessment:   Well woman visit with normal exam. Hx LGSIL.  Menopausal symptoms.  No cycle for almost 6 months. Weight gain.  Hx of low vit D level.  Plan: Yearly mammogram recommended after age 80.  Recommended self breast exam.  Pap and HR HPV as above. Discussed Calcium, Vitamin D, regular exercise program including cardiovascular and weight bearing exercise. I really encouraged weight loss today to reduce risk of DM and cardiovascular disease. Labs performed.  Yes.  .   See orders. Prescription medication(s) given.  Yes.  .  See orders.  Effexor 75 mg daily.   We discussed herbal remedies for tx of hot flashes.  Follow up annually and prn.   After visit summary provided.   Addendum  Patient called by nurse Marisa Sprinkles to confirm if she wished to have STD screening.  She desires this.   Will be  testing for HIV, syphilis, hep B and C, gonorrhea and chlamydia.

## 2016-04-04 NOTE — Patient Instructions (Signed)
Health Maintenance, Female Adopting a healthy lifestyle and getting preventive care can go a long way to promote health and wellness. Talk with your health care provider about what schedule of regular examinations is right for you. This is a good chance for you to check in with your provider about disease prevention and staying healthy. In between checkups, there are plenty of things you can do on your own. Experts have done a lot of research about which lifestyle changes and preventive measures are most likely to keep you healthy. Ask your health care provider for more information. WEIGHT AND DIET  Eat a healthy diet  Be sure to include plenty of vegetables, fruits, low-fat dairy products, and lean protein.  Do not eat a lot of foods high in solid fats, added sugars, or salt.  Get regular exercise. This is one of the most important things you can do for your health.  Most adults should exercise for at least 150 minutes each week. The exercise should increase your heart rate and make you sweat (moderate-intensity exercise).  Most adults should also do strengthening exercises at least twice a week. This is in addition to the moderate-intensity exercise.  Maintain a healthy weight  Body mass index (BMI) is a measurement that can be used to identify possible weight problems. It estimates body fat based on height and weight. Your health care provider can help determine your BMI and help you achieve or maintain a healthy weight.  For females 20 years of age and older:   A BMI below 18.5 is considered underweight.  A BMI of 18.5 to 24.9 is normal.  A BMI of 25 to 29.9 is considered overweight.  A BMI of 30 and above is considered obese.  Watch levels of cholesterol and blood lipids  You should start having your blood tested for lipids and cholesterol at 46 years of age, then have this test every 5 years.  You may need to have your cholesterol levels checked more often if:  Your lipid  or cholesterol levels are high.  You are older than 46 years of age.  You are at high risk for heart disease.  CANCER SCREENING   Lung Cancer  Lung cancer screening is recommended for adults 55-80 years old who are at high risk for lung cancer because of a history of smoking.  A yearly low-dose CT scan of the lungs is recommended for people who:  Currently smoke.  Have quit within the past 15 years.  Have at least a 30-pack-year history of smoking. A pack year is smoking an average of one pack of cigarettes a day for 1 year.  Yearly screening should continue until it has been 15 years since you quit.  Yearly screening should stop if you develop a health problem that would prevent you from having lung cancer treatment.  Breast Cancer  Practice breast self-awareness. This means understanding how your breasts normally appear and feel.  It also means doing regular breast self-exams. Let your health care provider know about any changes, no matter how small.  If you are in your 20s or 30s, you should have a clinical breast exam (CBE) by a health care provider every 1-3 years as part of a regular health exam.  If you are 40 or older, have a CBE every year. Also consider having a breast X-ray (mammogram) every year.  If you have a family history of breast cancer, talk to your health care provider about genetic screening.  If you   are at high risk for breast cancer, talk to your health care provider about having an MRI and a mammogram every year.  Breast cancer gene (BRCA) assessment is recommended for women who have family members with BRCA-related cancers. BRCA-related cancers include:  Breast.  Ovarian.  Tubal.  Peritoneal cancers.  Results of the assessment will determine the need for genetic counseling and BRCA1 and BRCA2 testing. Cervical Cancer Your health care provider may recommend that you be screened regularly for cancer of the pelvic organs (ovaries, uterus, and  vagina). This screening involves a pelvic examination, including checking for microscopic changes to the surface of your cervix (Pap test). You may be encouraged to have this screening done every 3 years, beginning at age 21.  For women ages 30-65, health care providers may recommend pelvic exams and Pap testing every 3 years, or they may recommend the Pap and pelvic exam, combined with testing for human papilloma virus (HPV), every 5 years. Some types of HPV increase your risk of cervical cancer. Testing for HPV may also be done on women of any age with unclear Pap test results.  Other health care providers may not recommend any screening for nonpregnant women who are considered low risk for pelvic cancer and who do not have symptoms. Ask your health care provider if a screening pelvic exam is right for you.  If you have had past treatment for cervical cancer or a condition that could lead to cancer, you need Pap tests and screening for cancer for at least 20 years after your treatment. If Pap tests have been discontinued, your risk factors (such as having a new sexual partner) need to be reassessed to determine if screening should resume. Some women have medical problems that increase the chance of getting cervical cancer. In these cases, your health care provider may recommend more frequent screening and Pap tests. Colorectal Cancer  This type of cancer can be detected and often prevented.  Routine colorectal cancer screening usually begins at 46 years of age and continues through 46 years of age.  Your health care provider may recommend screening at an earlier age if you have risk factors for colon cancer.  Your health care provider may also recommend using home test kits to check for hidden blood in the stool.  A small camera at the end of a tube can be used to examine your colon directly (sigmoidoscopy or colonoscopy). This is done to check for the earliest forms of colorectal  cancer.  Routine screening usually begins at age 50.  Direct examination of the colon should be repeated every 5-10 years through 46 years of age. However, you may need to be screened more often if early forms of precancerous polyps or small growths are found. Skin Cancer  Check your skin from head to toe regularly.  Tell your health care provider about any new moles or changes in moles, especially if there is a change in a mole's shape or color.  Also tell your health care provider if you have a mole that is larger than the size of a pencil eraser.  Always use sunscreen. Apply sunscreen liberally and repeatedly throughout the day.  Protect yourself by wearing long sleeves, pants, a wide-brimmed hat, and sunglasses whenever you are outside. HEART DISEASE, DIABETES, AND HIGH BLOOD PRESSURE   High blood pressure causes heart disease and increases the risk of stroke. High blood pressure is more likely to develop in:  People who have blood pressure in the high end   of the normal range (130-139/85-89 mm Hg).  People who are overweight or obese.  People who are African American.  If you are 38-23 years of age, have your blood pressure checked every 3-5 years. If you are 61 years of age or older, have your blood pressure checked every year. You should have your blood pressure measured twice--once when you are at a hospital or clinic, and once when you are not at a hospital or clinic. Record the average of the two measurements. To check your blood pressure when you are not at a hospital or clinic, you can use:  An automated blood pressure machine at a pharmacy.  A home blood pressure monitor.  If you are between 45 years and 39 years old, ask your health care provider if you should take aspirin to prevent strokes.  Have regular diabetes screenings. This involves taking a blood sample to check your fasting blood sugar level.  If you are at a normal weight and have a low risk for diabetes,  have this test once every three years after 46 years of age.  If you are overweight and have a high risk for diabetes, consider being tested at a younger age or more often. PREVENTING INFECTION  Hepatitis B  If you have a higher risk for hepatitis B, you should be screened for this virus. You are considered at high risk for hepatitis B if:  You were born in a country where hepatitis B is common. Ask your health care provider which countries are considered high risk.  Your parents were born in a high-risk country, and you have not been immunized against hepatitis B (hepatitis B vaccine).  You have HIV or AIDS.  You use needles to inject street drugs.  You live with someone who has hepatitis B.  You have had sex with someone who has hepatitis B.  You get hemodialysis treatment.  You take certain medicines for conditions, including cancer, organ transplantation, and autoimmune conditions. Hepatitis C  Blood testing is recommended for:  Everyone born from [birth year] through 1966.  Anyone with known risk factors for hepatitis C. Sexually transmitted infections (STIs)  You should be screened for sexually transmitted infections (STIs) including gonorrhea and chlamydia if:  You are sexually active and are younger than 46 years of age.  You are older than 46 years of age and your health care provider tells you that you are at risk for this type of infection.  Your sexual activity has changed since you were last screened and you are at an increased risk for chlamydia or gonorrhea. Ask your health care provider if you are at risk.  If you do not have HIV, but are at risk, it may be recommended that you take a prescription medicine daily to prevent HIV infection. This is called pre-exposure prophylaxis (PrEP). You are considered at risk if:  You are sexually active and do not regularly use condoms or know the HIV status of your partner(s).  You take drugs by injection.  You are sexually  active with a partner who has HIV. Talk with your health care provider about whether you are at high risk of being infected with HIV. If you choose to begin PrEP, you should first be tested for HIV. You should then be tested every 3 months for as long as you are taking PrEP.  PREGNANCY   If you are premenopausal and you may become pregnant, ask your health care provider about preconception counseling.  If you may  become pregnant, take 400 to 800 micrograms (mcg) of folic acid every day.  If you want to prevent pregnancy, talk to your health care provider about birth control (contraception). OSTEOPOROSIS AND MENOPAUSE   Osteoporosis is a disease in which the bones lose minerals and strength with aging. This can result in serious bone fractures. Your risk for osteoporosis can be identified using a bone density scan.  If you are 65 years of age or older, or if you are at risk for osteoporosis and fractures, ask your health care provider if you should be screened.  Ask your health care provider whether you should take a calcium or vitamin D supplement to lower your risk for osteoporosis.  Menopause may have certain physical symptoms and risks.  Hormone replacement therapy may reduce some of these symptoms and risks. Talk to your health care provider about whether hormone replacement therapy is right for you.  HOME CARE INSTRUCTIONS   Schedule regular health, dental, and eye exams.  Stay current with your immunizations.   Do not use any tobacco products including cigarettes, chewing tobacco, or electronic cigarettes.  If you are pregnant, do not drink alcohol.  If you are breastfeeding, limit how much and how often you drink alcohol.  Limit alcohol intake to no more than 1 drink per day for nonpregnant women. One drink equals 12 ounces of beer, 5 ounces of wine, or 1 ounces of hard liquor.  Do not use street drugs.  Do not share needles.  Ask your health care provider for help if  you need support or information about quitting drugs.  Tell your health care provider if you often feel depressed.  Tell your health care provider if you have ever been abused or do not feel safe at home.   This information is not intended to replace advice given to you by your health care provider. Make sure you discuss any questions you have with your health care provider.   Document Released: 05/12/2011 Document Revised: 11/17/2014 Document Reviewed: 09/28/2013 Elsevier Interactive Patient Education 2016 Elsevier Inc.  Menopause and Herbal Products WHAT IS MENOPAUSE? Menopause is the normal time of life when menstrual periods decrease in frequency and eventually stop completely. This process can take several years for some women. Menopause is complete when you have had an absence of menstruation for a full year since your last menstrual period. It usually occurs between the ages of 48 and 55. It is not common for menopause to begin before the age of 40. During menopause, your body stops producing the female hormones estrogen and progesterone. Common symptoms associated with this loss of hormones (vasomotor symptoms) are:  Hot flashes.  Hot flushes.  Night sweats. Other common symptoms and complications of menopause include:  Decrease in sex drive.  Vaginal dryness and thinning of the walls of the vagina. This can make sex painful.  Dryness of the skin and development of wrinkles.  Headaches.  Tiredness.  Irritability.  Memory problems.  Weight gain.  Bladder infections.  Hair growth on the face and chest.  Inability to reproduce offspring (infertility).  Loss of density in the bones (osteoporosis) increasing your risk for breaks (fractures).  Depression.  Hardening and narrowing of the arteries (atherosclerosis). This increases your risk of heart attack and stroke. WHAT TREATMENT OPTIONS ARE AVAILABLE? There are many treatment choices for menopause symptoms. The  most common treatment is hormone replacement therapy. Many alternative therapies for menopause are emerging, including the use of herbal products. These supplements can   be found in the form of herbs, teas, oils, tinctures, and pills. Common herbal supplements for menopause are made from plants that contain phytoestrogens. Phytoestrogens are compounds that occur naturally in plants and plant products. They act like estrogen in the body. Foods and herbs that contain phytoestrogens include:  Soy.  Flax seeds.  Red clover.  Ginseng. WHAT MENOPAUSE SYMPTOMS MAY BE HELPED IF I USE HERBAL PRODUCTS?  Vasomotor symptoms. These may be helped by:  Soy. Some studies show that soy may have a moderate benefit for hot flashes.  Black cohosh. There is limited evidence indicating this may be beneficial for hot flashes.  Symptoms that are related to heart and blood vessel disease. These may be helped by soy. Studies have shown that soy can help to lower cholesterol.  Depression. This may be helped by:  St. John's wort. There is limited evidence that shows this may help mild to moderate depression.  Black cohosh. There is evidence that this may help depression and mood swings.  Osteoporosis. Soy may help to decrease bone loss that is associated with menopause and may prevent osteoporosis. Limited evidence indicates that red clover may offer some bone loss protection as well. Other herbal products that are commonly used during menopause lack enough evidence to support their use as a replacement for conventional menopause therapies. These products include evening primrose, ginseng, and red clover. WHAT ARE THE CASES WHEN HERBAL PRODUCTS SHOULD NOT BE USED DURING MENOPAUSE? Do not use herbal products during menopause without your health care provider's approval if:  You are taking medicine.  You have a preexisting liver condition. ARE THERE ANY RISKS IN MY TAKING HERBAL PRODUCTS DURING MENOPAUSE? If you  choose to use herbal products to help with symptoms of menopause, keep in mind that:  Different supplements have different and unmeasured amounts of herbal ingredients.  Herbal products are not regulated the same way that medicines are.  Concentrations of herbs may vary depending on the way they are prepared. For example, the concentration may be different in a pill, tea, oil, and tincture.  Little is known about the risks of using herbal products, particularly the risks of long-term use.  Some herbal supplements can be harmful when combined with certain medicines. Most commonly reported side effects of herbal products are mild. However, if used improperly, many herbal supplements can cause serious problems. Talk to your health care provider before starting any herbal product. If problems develop, stop taking the supplement and let your health care provider know.   This information is not intended to replace advice given to you by your health care provider. Make sure you discuss any questions you have with your health care provider.   Document Released: 04/14/2008 Document Revised: 11/17/2014 Document Reviewed: 04/11/2014 Elsevier Interactive Patient Education 2016 Elsevier Inc.  

## 2016-04-04 NOTE — Addendum Note (Signed)
Addended by: Yisroel Ramming, Dietrich Pates E on: 04/04/2016 04:02 PM   Modules accepted: Orders

## 2016-04-05 LAB — GC/CHLAMYDIA PROBE AMP
CT Probe RNA: NOT DETECTED
GC Probe RNA: NOT DETECTED

## 2016-04-05 LAB — HEPATITIS C ANTIBODY: HCV AB: NEGATIVE

## 2016-04-05 LAB — VITAMIN D 25 HYDROXY (VIT D DEFICIENCY, FRACTURES): VIT D 25 HYDROXY: 23 ng/mL — AB (ref 30–100)

## 2016-04-05 LAB — STD PANEL
HIV: NONREACTIVE
Hepatitis B Surface Ag: NEGATIVE

## 2016-04-07 ENCOUNTER — Encounter: Payer: Self-pay | Admitting: Obstetrics and Gynecology

## 2016-04-07 ENCOUNTER — Other Ambulatory Visit: Payer: Self-pay | Admitting: Obstetrics and Gynecology

## 2016-04-07 DIAGNOSIS — E559 Vitamin D deficiency, unspecified: Secondary | ICD-10-CM

## 2016-04-07 MED ORDER — VITAMIN D (ERGOCALCIFEROL) 1.25 MG (50000 UNIT) PO CAPS: 50000.0000 [IU] | ORAL_CAPSULE | ORAL | Status: DC

## 2016-04-10 LAB — IPS PAP TEST WITH HPV

## 2016-04-14 LAB — HEMOGLOBIN, FINGERSTICK: Hemoglobin, fingerstick: 11.7 g/dL — ABNORMAL LOW (ref 12.0–16.0)

## 2016-04-15 ENCOUNTER — Telehealth: Payer: Self-pay | Admitting: Emergency Medicine

## 2016-04-15 DIAGNOSIS — IMO0002 Reserved for concepts with insufficient information to code with codable children: Secondary | ICD-10-CM

## 2016-04-15 NOTE — Telephone Encounter (Signed)
Patient notified of message from Dr. Quincy Simmonds.  She is agreeable to scheduling colposcopy and verbalized understanding of results and plan.   Scheduled for 04/25/16 at 1000.  Brief description of procedure given to patient.  Colposcopy pre-procedure instructions given. Discussed menses and need to not have any bleeding on day of appointment, advised to call to reschedule if starts cycle.  Patient states she has not had cycle in 6 months, since December. Labs indicate perimenopause.  Make sure to eat a meal and hydrate before appointment.  Advised 800 mg of Motrin PO with food one hour prior to appointment.  Patient verbalized understanding of preprocedure instructions and will call to reschedule if will be on menses or has any concerns regarding pregnancy.  Patient is advised she will be contacted with insurance coverage information. cc Becky Frahm/Suzy Dixon.   Routing to provider for final review. Patient agreeable to disposition. Will close encounter.

## 2016-04-15 NOTE — Telephone Encounter (Signed)
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  It is time for a colposcopy again.  Patient had this last year as well and had LGSIL on bx.  Cc- Karen Avila

## 2016-04-25 ENCOUNTER — Ambulatory Visit (INDEPENDENT_AMBULATORY_CARE_PROVIDER_SITE_OTHER): Payer: Commercial Managed Care - PPO | Admitting: Obstetrics and Gynecology

## 2016-04-25 ENCOUNTER — Encounter: Payer: Self-pay | Admitting: Obstetrics and Gynecology

## 2016-04-25 VITALS — BP 130/76 | HR 70 | Ht 61.0 in | Wt 165.0 lb

## 2016-04-25 DIAGNOSIS — N912 Amenorrhea, unspecified: Secondary | ICD-10-CM | POA: Diagnosis not present

## 2016-04-25 DIAGNOSIS — R896 Abnormal cytological findings in specimens from other organs, systems and tissues: Secondary | ICD-10-CM | POA: Diagnosis not present

## 2016-04-25 DIAGNOSIS — IMO0002 Reserved for concepts with insufficient information to code with codable children: Secondary | ICD-10-CM

## 2016-04-25 LAB — POCT URINE PREGNANCY: PREG TEST UR: NEGATIVE

## 2016-04-25 NOTE — Progress Notes (Signed)
Subjective:     Patient ID: Karen Avila, female   DOB: February 01, 1971, 45 y.o.   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 Hx cryotherapy to cervix 1996.  UPT: Neg FSH 46 in May 2017.  Review of Systems  LMP: 11-04-15 Contraception: condoms everytime     Objective:   Physical Exam  Genitourinary:     Colposcopy of cervix and vagina.   Consent for procedure.  Speculum placed in vagina.  3% acetic acid used.  Satisfactory colposcopy.  Atrophy changes noted.  ECC taken and sent to path.  Biopsy of small acetowhite area at 7:00 and sent to path. Monsels placed.  Minimal EBL.  No complications.   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     Plan:     Discussed low risk HPV as likely cause for cellular changes on the cervix.  Atrophy can also contribute to this.  At a minimum, needs cotesting in one year.  Will follow up on biopsy results to determine if anything further needed.  After visit summary to patient.

## 2016-04-25 NOTE — Patient Instructions (Signed)

## 2016-04-30 LAB — IPS OTHER TISSUE BIOPSY

## 2016-07-28 ENCOUNTER — Other Ambulatory Visit: Payer: Commercial Managed Care - PPO

## 2016-07-28 ENCOUNTER — Telehealth: Payer: Self-pay | Admitting: Obstetrics and Gynecology

## 2016-07-28 DIAGNOSIS — E559 Vitamin D deficiency, unspecified: Secondary | ICD-10-CM

## 2016-07-28 NOTE — Telephone Encounter (Signed)
Spoke with patient. Patient would like to know if she may have her labs performed in [facility] or [place]. Advised she may be seen at [facility] lab in either location. Provided address to [place] telephone [phone], [facility] [address] [place] telephone [phone]. [place] telephone [phone]. Patient is aware she will need to contact location of choice to schedule an appointment. Patient will notify the office of appointment date and time. Order for Vitamin D has been released as lab collect so that [place] [address] will have access to this electronically. Patient is agreeable.  Routing to provider for final review. Patient agreeable to disposition. Will close encounter.

## 2016-07-28 NOTE — Telephone Encounter (Signed)
Patient is asking if she could have her vitamin d checked at another lab closer to her. Patient had missed her lab appointment this morning.

## 2016-07-28 NOTE — Telephone Encounter (Signed)
Left message to call and reschedule missed lab appointment. °

## 2016-07-30 LAB — VITAMIN D 25 HYDROXY (VIT D DEFICIENCY, FRACTURES): VIT D 25 HYDROXY: 58 ng/mL (ref 30–100)

## 2016-08-05 ENCOUNTER — Encounter: Payer: Self-pay | Admitting: Obstetrics and Gynecology

## 2016-09-09 ENCOUNTER — Encounter: Payer: Self-pay | Admitting: Obstetrics and Gynecology

## 2016-09-10 ENCOUNTER — Other Ambulatory Visit: Payer: Self-pay | Admitting: Obstetrics and Gynecology

## 2016-09-10 MED ORDER — VENLAFAXINE HCL ER 37.5 MG PO CP24: 37.5000 mg | ORAL_CAPSULE | Freq: Every day | ORAL | 2 refills | Status: DC

## 2016-10-09 ENCOUNTER — Emergency Department (INDEPENDENT_AMBULATORY_CARE_PROVIDER_SITE_OTHER)
Admission: EM | Admit: 2016-10-09 | Discharge: 2016-10-09 | Disposition: A | Discharge status: Home / Self Care | Payer: Commercial Managed Care - PPO | Source: Home / Self Care | Attending: Family Medicine | Admitting: Family Medicine

## 2016-10-09 ENCOUNTER — Encounter: Payer: Self-pay | Admitting: Emergency Medicine

## 2016-10-09 DIAGNOSIS — B9789 Other viral agents as the cause of diseases classified elsewhere: Secondary | ICD-10-CM | POA: Diagnosis not present

## 2016-10-09 DIAGNOSIS — J069 Acute upper respiratory infection, unspecified: Secondary | ICD-10-CM

## 2016-10-09 MED ORDER — AMOXICILLIN 875 MG PO TABS: 875.0000 mg | ORAL_TABLET | Freq: Two times a day (BID) | ORAL | 0 refills | Status: DC

## 2016-10-09 MED ORDER — PREDNISONE 20 MG PO TABS: ORAL_TABLET | ORAL | 0 refills | Status: DC

## 2016-10-09 NOTE — Discharge Instructions (Signed)
Increase fluid intake.  Get adequate rest.   May use Afrin nasal spray (or generic oxymetazoline) twice daily for about 5 days and then discontinue.  Also recommend using saline nasal spray several times daily and saline nasal irrigation (AYR is a common brand).  Use Flonase nasal spray each morning after using Afrin nasal spray and saline nasal irrigation. Try warm salt water gargles for sore throat.  Stop all antihistamines for now, and other non-prescription cough/cold preparations. May take Delsym Cough Suppressant at bedtime for nighttime cough.  Begin Amoxicillin if not improving about one week or if persistent fever develops   Follow-up with family doctor if not improving about10 days.

## 2016-10-09 NOTE — ED Triage Notes (Signed)
Congestion, ear pain x 2 days

## 2016-10-09 NOTE — ED Provider Notes (Addendum)
[facility]    CSN: [id number] Arrival date & time: 10/09/16  1952     History   Chief Complaint Chief Complaint  Patient presents with  . Otalgia    HPI Karen Avila is a 45 y.o. female.   Patient complains of three day history of typical cold-like symptoms developing over several days, including mild sore throat, sinus congestion, headache, fatigue, myalgias, and cough.  She also complains of bilateral earaches.  No fevers, chills, and sweats. She has a history of seasonal rhinitis, and past history of sinus surgery.  She has a family history of asthma (mother and nephew)   The history is provided by the patient.    Past Medical History:  Diagnosis Date  . Abnormal Pap smear of cervix 04-05-15   LGSIL/Neg HR HPV  . Abnormal uterine bleeding 2000   fibroids  . Anemia    past h/o anemia  . Cancer ([place]) 1995   pre cancerous skin cells Lt medial calf  . 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 Fibroid 2000  . Migraine, unspecified migraine type    --complex  . Migraines   . Vitamin D deficiency 06/11/15    Patient Active Problem List   Diagnosis Date Noted  . EXUDATIVE PHARYNGITIS 07/19/2008    Past Surgical History:  Procedure Laterality Date  . APPENDECTOMY  1977  . BREAST SURGERY Left 2000   lumpectomy  . CHOLECYSTECTOMY  10/2015   --[facility]  . GYNECOLOGIC CRYOSURGERY  1996  . papilloma removal of left breast Left   . UTERINE ARTERY EMBOLIZATION  2011    OB History    Gravida Para Term Preterm AB Living   4 1 1   3 1    SAB TAB Ectopic Multiple Live Births   3       1       Home Medications    Prior to Admission medications   Medication Sig Start Date End Date Taking? Authorizing Provider  Acetaminophen (TYLENOL PO) Take by mouth as needed.    Historical Provider, MD  albuterol (PROVENTIL HFA;VENTOLIN HFA) 108 (90 BASE) MCG/ACT inhaler Inhale 2 puffs into the lungs every 4 (four) hours  as needed for wheezing or shortness of breath.    Historical Provider, MD  amoxicillin (AMOXIL) 875 MG tablet Take 1 tablet (875 mg total) by mouth 2 (two) times daily. (Rx void after 10/18/16) 10/09/16   Kandra Nicolas, MD  predniSONE (DELTASONE) 20 MG tablet Take one tab by mouth twice daily for 5 days, then one daily. Take with food. 10/09/16   Kandra Nicolas, MD  SUMAtriptan (IMITREX) 25 MG tablet Take 25 mg by mouth every 2 (two) hours as needed for migraine or headache. May repeat in 2 hours if headache persists or recurs.    Historical Provider, MD  venlafaxine XR (EFFEXOR XR) 37.5 MG 24 hr capsule Take 1 capsule (37.5 mg total) by mouth daily. 09/10/16   Garfield, MD  Vitamin D, Ergocalciferol, (DRISDOL) 50000 units CAPS capsule Take 1 capsule (50,000 Units total) by mouth 2 (two) times a week. 04/07/16   Nunzio Cobbs, MD    Family History Family History  Problem Relation Age of Onset  . Osteoarthritis Mother   . Asthma Mother   . Cancer Mother     skin cancer  . Hyperlipidemia Mother   . Hypertension Mother   . Migraines Mother   .  Stroke Mother   . Hyperlipidemia Father   . Hypertension Father   . Diabetes Father 54    pre diabetic  . Diabetes Sister 60    now insukin pump  . Breast cancer Maternal Grandmother   . Heart failure Maternal Grandmother     Social History Social History  Substance Use Topics  . Smoking status: Never Smoker  . Smokeless tobacco: Never Used  . Alcohol use 0.0 oz/week     Comment: 1-2 glasses of wine/month     Allergies   Tramadol   Review of Systems Review of Systems  + sore throat + cough No pleuritic pain No wheezing + nasal congestion + post-nasal drainage No sinus pain/pressure No itchy/red eyes + earache No hemoptysis No SOB No fever/chills No nausea No vomiting No abdominal pain No diarrhea No urinary symptoms No skin rash + fatigue + myalgias + headache Used OTC meds without relief     Physical Exam Triage Vital Signs ED Triage Vitals  Enc Vitals Group     BP 10/09/16 2014 121/78     Pulse Rate 10/09/16 2014 91     Resp --      Temp 10/09/16 2014 98.7 F (37.1 C)     Temp Source 10/09/16 2014 Oral     SpO2 10/09/16 2014 98 %     Weight 10/09/16 2014 158 lb (71.7 kg)     Height 10/09/16 2014 5\' 1"  (1.549 m)     Head Circumference --      Peak Flow --      Pain Score 10/09/16 2017 4     Pain Loc --      Pain Edu? --      Excl. in [place]? --    No data found.   Updated Vital Signs BP 121/78 (BP Location: Left Arm)   Pulse 91   Temp 98.7 F (37.1 C) (Oral)   Ht 5\' 1"  (1.549 m)   Wt 158 lb (71.7 kg)   SpO2 98%   BMI 29.85 kg/m   Visual Acuity Right Eye Distance:   Left Eye Distance:   Bilateral Distance:    Right Eye Near:   Left Eye Near:    Bilateral Near:     Physical Exam Nursing notes and Vital Signs reviewed. Appearance:  Patient appears stated age, and in no acute distress Eyes:  Pupils are equal, round, and reactive to light and accomodation.  Extraocular movement is intact.  Conjunctivae are not inflamed  Ears:  Canals normal.  Tympanic membranes normal.  Nose:  Mildly congested turbinates.  No sinus tenderness.   Pharynx:  Normal Neck:  Supple.  Tender enlarged posterior/lateral nodes are palpated bilaterally  Lungs:  Clear to auscultation.  Breath sounds are equal.  Moving air well. Chest:  Distinct tenderness to palpation over the mid-sternum.  Heart:  Regular rate and rhythm without murmurs, rubs, or gallops.  Abdomen:  Nontender without masses or hepatosplenomegaly.  Bowel sounds are present.  No CVA or flank tenderness.  Extremities:  No edema.  Skin:  No rash present.    UC Treatments / Results  Labs (all labs ordered are listed, but only abnormal results are displayed) Labs Reviewed - No data to display  EKG  EKG Interpretation None       Radiology No results found.  Procedures Procedures (including critical  care time)  Medications Ordered in UC Medications - No data to display   Initial Impression / Assessment and Plan /  UC Course  I have reviewed the triage vital signs and the nursing notes.  Pertinent labs & imaging results that were available during my care of the patient were reviewed by me and considered in my medical decision making (see chart for details).  Clinical Course   There is no evidence of bacterial infection today.   With patient's history of seasonal rhinitis, family history of asthma, and past history of frequent sinusitis, she probably has a predisposition to mild reactive airways disease. Will begin prednisone burst/taper.  Increase fluid intake.  Get adequate rest.   May use Afrin nasal spray (or generic oxymetazoline) twice daily for about 5 days and then discontinue.  Also recommend using saline nasal spray several times daily and saline nasal irrigation (AYR is a common brand).  Use Flonase nasal spray each morning after using Afrin nasal spray and saline nasal irrigation. Try warm salt water gargles for sore throat.  Stop all antihistamines for now, and other non-prescription cough/cold preparations. May take Delsym Cough Suppressant at bedtime for nighttime cough.  Begin Amoxicillin if not improving about one week or if persistent fever develops (Given a prescription to hold, with an expiration date)  Follow-up with family doctor if not improving about10 days.      Final Clinical Impressions(s) / UC Diagnoses   Final diagnoses:  Viral URI    New Prescriptions New Prescriptions   AMOXICILLIN (AMOXIL) 875 MG TABLET    Take 1 tablet (875 mg total) by mouth 2 (two) times daily. (Rx void after 10/18/16)   PREDNISONE (DELTASONE) 20 MG TABLET    Take one tab by mouth twice daily for 5 days, then one daily. Take with food.     Kandra Nicolas, MD 10/16/16 1904    Kandra Nicolas, MD 10/16/16 [phone]

## 2016-10-16 ENCOUNTER — Encounter: Payer: Self-pay | Admitting: Obstetrics and Gynecology

## 2016-10-17 ENCOUNTER — Other Ambulatory Visit: Payer: Self-pay | Admitting: Obstetrics and Gynecology

## 2016-10-17 MED ORDER — VENLAFAXINE HCL ER 37.5 MG PO TB24
1.0000 | ORAL_TABLET | Freq: Every day | ORAL | 2 refills | Status: DC
Start: 2016-10-17 — End: 2017-04-10

## 2017-04-09 NOTE — Progress Notes (Signed)
46 y.o. G44P1031 Divorced Caucasian female here for annual exam.  Patient complains of having urinary incontinence for about 3 months.  Menses in July 2017 and April 2018 in the last year. Taking herbal products for menopausal symptoms.  Is off Effexor.   Some urgency.  No dysuria.  Urinary incontinence spontaneously and with cough/sneeze.  Having excessive thirst, heat and cold intolerance, and craving sweets.  Having rotator cuff problems and elevated CRP and Sed rate 02/21/17 at [facility]. Had normal TSH then.  Stated she had a negative arthritis panel.  Decreased appetite.  Not eating healthy.  Had syncopal episode since her last visit.  Had a negative work up.   Working hard.   PCP:  Alexii Radiontchenko - [facility].  Patient's last menstrual period was 03/09/2017.           Sexually active: Yes.    The current method of family planning is condoms   Exercising: No.  The patient does not participate in regular exercise at present. Smoker:  no  Health Maintenance: Pap:  04/04/16 Pap shows LGSIL with Negative HR HPV -- colposcopy ECC benign and Cx Bx CIN I.  04/05/15 Pap shows LGSIL with Negative HR HPV -- colposcopy ECC--Neg, Cx Bx CIN I.  02/17/14 Pap and HR HPV negative History of abnormal Pap:  Yes; see above/ Hx of cryotherapy to cervix 1996. MMG:  07/23/16 Left Breast MMG -- density c; no evidence of malignancy Colonoscopy:  n/a BMD:   n/a  Result  n/a TDaP:  2013 Gardasil:   N/a HIV: in pregnancy - negative Hep C: N/A Screening Labs:   Urine today: 2+ WBC   reports that she has never smoked. She has never used smokeless tobacco. She reports that she drinks alcohol. She reports that she does not use drugs.  Past Medical History:  Diagnosis Date  . Abnormal Pap smear of cervix 04-05-15   LGSIL/Neg HR HPV  . Abnormal uterine bleeding 2000   fibroids  . Anemia    past h/o anemia  . Cancer ([place]) 1995   pre cancerous skin cells Lt medial calf  .  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 Fibroid 2000  . Migraine, unspecified migraine type    --complex  . Migraines   . Vitamin D deficiency 06/11/15    Past Surgical History:  Procedure Laterality Date  . APPENDECTOMY  1977  . BREAST SURGERY Left 2000   lumpectomy  . CHOLECYSTECTOMY  10/2015   --[facility]  . GYNECOLOGIC CRYOSURGERY  1996  . papilloma removal of left breast Left   . UTERINE ARTERY EMBOLIZATION  2011    Current Outpatient Prescriptions  Medication Sig Dispense Refill  . Acetaminophen (TYLENOL PO) Take by mouth as needed.    . Cholecalciferol (VITAMIN D3) 5000 units CAPS Take 1 capsule by mouth every other day.    . SUMAtriptan (IMITREX) 25 MG tablet Take 25 mg by mouth every 2 (two) hours as needed for migraine or headache. May repeat in 2 hours if headache persists or recurs.     No current facility-administered medications for this visit.     Family History  Problem Relation Age of Onset  . Osteoarthritis Mother   . Asthma Mother   . Cancer Mother        skin cancer  . Hyperlipidemia Mother   . Hypertension Mother   . Migraines Mother   . Stroke Mother   . Hyperlipidemia  Father   . Hypertension Father   . Diabetes Father 41       pre diabetic  . Diabetes Sister 58       now insukin pump  . Breast cancer Maternal Grandmother   . Heart failure Maternal Grandmother     ROS:  Pertinent items are noted in HPI.  Otherwise, a comprehensive ROS was negative.  Exam:   BP 102/70 (BP Location: Right Arm, Patient Position: Sitting, Cuff Size: Normal)   Pulse 84   Resp 16   Ht 5' 1.5" (1.562 m)   Wt 161 lb (73 kg)   LMP 03/09/2017   BMI 29.93 kg/m     General appearance: alert, cooperative and appears stated age Head: Normocephalic, without obvious abnormality, atraumatic Neck: no adenopathy, supple, symmetrical, trachea midline and thyroid normal to inspection and palpation Lungs: clear to auscultation  bilaterally Breasts: normal appearance, no masses or tenderness, No nipple retraction or dimpling, No nipple discharge or bleeding, No axillary or supraclavicular adenopathy Heart: regular rate and rhythm Abdomen: soft, non-tender; no masses, no organomegaly Extremities: extremities normal, atraumatic, no cyanosis or edema Skin: Skin color, texture, turgor normal. No rashes or lesions Lymph nodes: Cervical, supraclavicular, and axillary nodes normal. No abnormal inguinal nodes palpated Neurologic: Grossly normal  Pelvic: External genitalia:  no lesions              Urethra:  normal appearing urethra with no masses, tenderness or lesions              Bartholins and Skenes: normal                 Vagina: normal appearing vagina with normal color and discharge, no lesions              Cervix: no lesions              Pap taken: Yes.   Bimanual Exam:  Uterus:   6 - 7 week size irregular uterus.              Adnexa: no mass, fullness, tenderness              Rectal exam: Yes.  .  Confirms.              Anus:  normal sphincter tone, no lesions  Chaperone was present for exam.  Assessment:   Well woman visit with normal exam. Hx LGSIL.  Abnormal urine dip.  Vaginal versus urine odor. Desire for STD testing.  Fibroids.  Status post uterine artery embolization.  Perimenopausal female.   Plan: Mammogram screening discussed. Recommended self breast awareness. Pap and HR HPV as above. Trichomonas, CG/CT, BV and yeast from pap. Guidelines for Calcium, Vitamin D, regular exercise program including cardiovascular and weight bearing exercise. Return for routine labs and serum STD testing.  Urine micro and culture.  She will establish care with internal medicine.  Follow up annually and prn.   After visit summary provided.

## 2017-04-10 ENCOUNTER — Other Ambulatory Visit (HOSPITAL_COMMUNITY)
Admission: RE | Admit: 2017-04-10 | Discharge: 2017-04-10 | Disposition: A | Discharge status: Home / Self Care | Payer: Commercial Managed Care - PPO | Source: Ambulatory Visit | Attending: Obstetrics and Gynecology | Admitting: Obstetrics and Gynecology

## 2017-04-10 ENCOUNTER — Encounter: Payer: Self-pay | Admitting: Obstetrics and Gynecology

## 2017-04-10 ENCOUNTER — Ambulatory Visit (INDEPENDENT_AMBULATORY_CARE_PROVIDER_SITE_OTHER): Payer: Commercial Managed Care - PPO | Admitting: Obstetrics and Gynecology

## 2017-04-10 VITALS — BP 102/70 | HR 84 | Resp 16 | Ht 61.5 in | Wt 161.0 lb

## 2017-04-10 DIAGNOSIS — Z119 Encounter for screening for infectious and parasitic diseases, unspecified: Secondary | ICD-10-CM

## 2017-04-10 DIAGNOSIS — R3915 Urgency of urination: Secondary | ICD-10-CM

## 2017-04-10 DIAGNOSIS — Z01419 Encounter for gynecological examination (general) (routine) without abnormal findings: Secondary | ICD-10-CM | POA: Diagnosis present

## 2017-04-10 DIAGNOSIS — N898 Other specified noninflammatory disorders of vagina: Secondary | ICD-10-CM | POA: Diagnosis not present

## 2017-04-10 LAB — POCT URINALYSIS DIPSTICK
Bilirubin, UA: NEGATIVE
Blood, UA: NEGATIVE
GLUCOSE UA: NEGATIVE
Ketones, UA: NEGATIVE
Nitrite, UA: NEGATIVE
PROTEIN UA: NEGATIVE
UROBILINOGEN UA: 0.2 U/dL
pH, UA: 8 (ref 5.0–8.0)

## 2017-04-10 NOTE — Patient Instructions (Signed)

## 2017-04-11 LAB — URINALYSIS, MICROSCOPIC ONLY: Casts: NONE SEEN /lpf

## 2017-04-12 LAB — URINE CULTURE

## 2017-04-16 ENCOUNTER — Telehealth: Payer: Self-pay | Admitting: *Deleted

## 2017-04-16 ENCOUNTER — Encounter: Payer: Self-pay | Admitting: Obstetrics and Gynecology

## 2017-04-16 LAB — CYTOLOGY - PAP
Bacterial vaginitis: NEGATIVE
CANDIDA VAGINITIS: NEGATIVE
Chlamydia: NEGATIVE
DIAGNOSIS: NEGATIVE
HPV (WINDOPATH): NOT DETECTED
Neisseria Gonorrhea: NEGATIVE
Trichomonas: NEGATIVE

## 2017-04-16 NOTE — Telephone Encounter (Signed)
Spoke with patient, advised of results and recommendations as seen below per Dr. Quincy Simmonds. Patient has additional questions about urine culture results received via MyChart on 04/12/17. Reports she is still experiencing urinary incontinence at times, urgency and states urine has odor and darker color. Patient is concerned with WBC noted in urine, would like Dr. Quincy Simmonds to look at this again. Advised patient would review with Dr. Quincy Simmonds and return call with any additional recommendations, patient is agreeable.  08 recall placed.   Dr. Quincy Simmonds -please review and advise?   Notes recorded by Nunzio Cobbs, MD on 04/16/2017 at 12:46 PM EDT Please report pap showing normal cells and negative HR HPV testing.  Her testing is also negative for gonorrhea, chlamydia, yeast, bacterial vaginosis, and trichomonas.   Please enter 08 recall.  This is follow up to LGSIL from 2015 and 2016.

## 2017-04-16 NOTE — Telephone Encounter (Signed)
Patient returning your call.

## 2017-04-16 NOTE — Telephone Encounter (Signed)
Spoke with patient, advised as seen below per Dr. Quincy Simmonds. Patient agreeable to OV for evaluation. Patient previously scheduled for labs on 04/17/17 at 10am,cancelled this lab appointment, scheduled for OV on 04/17/17 at 1:30pm with Dr. Quincy Simmonds, can have labs drawn at that time. Patient verbalizes understanding and is agreeable.  Routing to provider for final review. Patient is agreeable to disposition. Will close encounter.

## 2017-04-16 NOTE — Telephone Encounter (Signed)
Left message to call Karen Avila at [phone].  

## 2017-04-16 NOTE — Telephone Encounter (Signed)
I noticed the white blood cells on the micro as well, but this looks to be from mucous, which was also noted on the specimen.  I welcome patient to come back in for re-evaluation with me and we can focus much more on the urinary control and symptoms she has!

## 2017-04-17 ENCOUNTER — Encounter: Payer: Self-pay | Admitting: Obstetrics and Gynecology

## 2017-04-17 ENCOUNTER — Ambulatory Visit (INDEPENDENT_AMBULATORY_CARE_PROVIDER_SITE_OTHER): Payer: Commercial Managed Care - PPO | Admitting: Obstetrics and Gynecology

## 2017-04-17 ENCOUNTER — Other Ambulatory Visit: Payer: Commercial Managed Care - PPO

## 2017-04-17 VITALS — BP 122/70 | HR 68 | Resp 16 | Wt 161.0 lb

## 2017-04-17 DIAGNOSIS — R350 Frequency of micturition: Secondary | ICD-10-CM

## 2017-04-17 DIAGNOSIS — Z01419 Encounter for gynecological examination (general) (routine) without abnormal findings: Secondary | ICD-10-CM | POA: Diagnosis not present

## 2017-04-17 DIAGNOSIS — N3946 Mixed incontinence: Secondary | ICD-10-CM

## 2017-04-17 DIAGNOSIS — Z119 Encounter for screening for infectious and parasitic diseases, unspecified: Secondary | ICD-10-CM

## 2017-04-17 LAB — POCT URINALYSIS DIPSTICK
BILIRUBIN UA: NEGATIVE
Glucose, UA: NEGATIVE
Ketones, UA: NEGATIVE
NITRITE UA: NEGATIVE
PH UA: 8 (ref 5.0–8.0)
Protein, UA: NEGATIVE
RBC UA: NEGATIVE
Urobilinogen, UA: 0.2 E.U./dL

## 2017-04-17 NOTE — Progress Notes (Signed)
GYNECOLOGY  VISIT   HPI: 46 y.o.   Divorced  Caucasian  female   [phone] with Patient's last menstrual period was 03/09/2017.   here for urinary symptoms.     Worried about the urine WBCs noted last week.   Urine changes.  Can be dark or light.  Can have odor.  Can have urgency and then voids and nothing is there. Some urgency and then cannot make it on time.  Having urgency when she is walking to the bathroom.  DF - every 90 minutes and does have full bladder.  NF - none.   No dysuria.  No hematuria.  Last UTI was 2 years ago.  No hx of stones.   Leaks with laugh, exercise, bending over, coughing or sneezing.  Not using a pad.   She had a negative GC/CT and negative vaginitis panel.  She scanned her own kidneys, and they looked normal with no hydronephrosis.  She states she has a low residual volume following voiding - about 5 cc.  Leans forward to void.   Bowel function is better since chole.  Sometimes has firm stool. No fecal incontinence.  No splinting.   Urine today - trace WBCs.   GYNECOLOGIC HISTORY: Patient's last menstrual period was 03/09/2017. Contraception:  Condoms Menopausal hormone therapy:  none Last mammogram:  07/23/16 Left Breast MMG -- density c; no evidence of malignancy Last pap smear:   04/04/16 Pap shows LGSIL with Negative HR HPV -- colposcopy ECC benign and Cx Bx CIN I.             04/05/15 Pap shows LGSIL with Negative HR HPV -- colposcopy ECC--Neg, Cx Bx CIN I.             02/17/14 Pap and HR HPV negative        OB History    Gravida Para Term Preterm AB Living   4 1 1   3 1    SAB TAB Ectopic Multiple Live Births   3       1         Patient Active Problem List   Diagnosis Date Noted  . EXUDATIVE PHARYNGITIS 07/19/2008    Past Medical History:  Diagnosis Date  . Abnormal Pap smear of cervix 04-05-15   LGSIL/Neg HR HPV  . Abnormal uterine bleeding 2000   fibroids  . Anemia    past h/o anemia  . Cancer ([place]) 1995   pre  cancerous skin cells Lt medial calf  . 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 Fibroid 2000  . Migraine, unspecified migraine type    --complex  . Migraines   . 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 Vitamin D deficiency 06/11/15    Past Surgical History:  Procedure Laterality Date  . APPENDECTOMY  1977  . BREAST SURGERY Left 2000   lumpectomy  . CHOLECYSTECTOMY  10/2015   --[facility]  . GYNECOLOGIC CRYOSURGERY  1996  . papilloma removal of left breast Left   . UTERINE ARTERY EMBOLIZATION  2011    Current Outpatient Prescriptions  Medication Sig Dispense Refill  . Acetaminophen (TYLENOL PO) Take by mouth as needed.    . Cholecalciferol (VITAMIN D3) 5000 units CAPS Take 1 capsule by mouth every other day.    . SUMAtriptan (IMITREX) 25 MG tablet Take 25 mg by mouth every 2 (two) hours as needed for migraine or headache. May repeat in  2 hours if headache persists or recurs.     No current facility-administered medications for this visit.      ALLERGIES: Tramadol  Family History  Problem Relation Age of Onset  . Osteoarthritis Mother   . Asthma Mother   . Cancer Mother        skin cancer  . Hyperlipidemia Mother   . Hypertension Mother   . Migraines Mother   . Stroke Mother   . Hyperlipidemia Father   . Hypertension Father   . Diabetes Father 55       pre diabetic  . Diabetes Sister 31       now insukin pump  . Breast cancer Maternal Grandmother   . Heart failure Maternal Grandmother     Social History   Social History  . Marital status: Divorced    Spouse name: N/A  . Number of children: N/A  . Years of education: N/A   Occupational History  . Not on file.   Social History Main Topics  . Smoking status: Never Smoker  . Smokeless tobacco: Never Used  . Alcohol use 0.0 oz/week     Comment: 1-2 glasses of wine/month  . Drug use: No  . Sexual activity: Yes    Partners: Male    Birth  control/ protection: Condom     Comment: condoms everytime   Other Topics Concern  . Not on file   Social History Narrative  . No narrative on file    ROS:  Pertinent items are noted in HPI.  PHYSICAL EXAMINATION:    BP 122/70 (BP Location: Right Arm, Patient Position: Sitting, Cuff Size: Normal)   Pulse 68   Resp 16   Wt 161 lb (73 kg)   LMP 03/09/2017   BMI 29.93 kg/m     General appearance: alert, cooperative and appears stated age    Pelvic: External genitalia:  no lesions              Urethra:  normal appearing urethra with no masses, tenderness or lesions              Bartholins and Skenes: normal                 Vagina: normal appearing vagina with normal color and discharge, no lesions.  No real cystocele.              Cervix: no lesions.  Menstrual flow noted from os.                 Bimanual Exam:  Uterus:  7 week size.              Adnexa: no mass, fullness, tenderness           Chaperone was present for exam.  ASSESSMENT  Mixed incontience.  Urinary odor.  Current menses.   PLAN  Check urine micro and culture.  Discussed mixed incontinence.  We reviewed PT, avoiding bladder irritants, Anticholinergics/antimuscarinics, Impressa, pessary, and midurethral sling.  She chooses PT.  Will refer to [employer].  Risks of midurethral sling - erosions/exposures of mesh, cystotomy, urinary retention, slower voiding, increased voiding time, and periop UTIs.  She will do her fasting labs and serum STD check today.  Follow up prn.    An After Visit Summary was printed and given to the patient.  __25____ minutes face to face time of which over 50% was spent in counseling.

## 2017-04-17 NOTE — Patient Instructions (Signed)
Urinary Incontinence Urinary incontinence is the involuntary loss of urine from your bladder. What are the causes? There are many causes of urinary incontinence. They include:  Medicines.  Infections.  Prostatic enlargement, leading to overflow of urine from your bladder.  Surgery.  Neurological diseases.  Emotional factors.  What are the signs or symptoms? Urinary Incontinence can be divided into four types: 1. Urge incontinence. Urge incontinence is the involuntary loss of urine before you have the opportunity to go to the bathroom. There is a sudden urge to void but not enough time to reach a bathroom. 2. Stress incontinence. Stress incontinence is the sudden loss of urine with any activity that forces urine to pass. It is commonly caused by anatomical changes to the pelvis and sphincter areas of your body. 3. Overflow incontinence. Overflow incontinence is the loss of urine from an obstructed opening to your bladder. This results in a backup of urine and a resultant buildup of pressure within the bladder. When the pressure within the bladder exceeds the closing pressure of the sphincter, the urine overflows, which causes incontinence, similar to water overflowing a dam. 4. Total incontinence. Total incontinence is the loss of urine as a result of the inability to store urine within your bladder.  How is this diagnosed? Evaluating the cause of incontinence may require:  A thorough and complete medical and obstetric history.  A complete physical exam.  Laboratory tests such as a urine culture and sensitivities.  When additional tests are indicated, they can include:  An ultrasound exam.  Kidney and bladder X-rays.  Cystoscopy. This is an exam of the bladder using a narrow scope.  Urodynamic testing to test the nerve function to the bladder and sphincter areas.  How is this treated? Treatment for urinary incontinence depends on the cause:  For urge incontinence caused  by a bacterial infection, antibiotics will be prescribed. If the urge incontinence is related to medicines you take, your health care provider may have you change the medicine.  For stress incontinence, surgery to re-establish anatomical support to the bladder or sphincter, or both, will often correct the condition.  For overflow incontinence caused by an enlarged prostate, an operation to open the channel through the enlarged prostate will allow the flow of urine out of the bladder. In women with fibroids, a hysterectomy may be recommended.  For total incontinence, surgery on your urinary sphincter may help. An artificial urinary sphincter (an inflatable cuff placed around the urethra) may be required. In women who have developed a hole-like passage between their bladder and vagina (vesicovaginal fistula), surgery to close the fistula often is required.  Follow these instructions at home:  Normal daily hygiene and the use of pads or adult diapers that are changed regularly will help prevent odors and skin damage.  Avoid caffeine. It can overstimulate your bladder.  Use the bathroom regularly. Try about every 2-3 hours to go to the bathroom, even if you do not feel the need to do so. Take time to empty your bladder completely. After urinating, wait a minute. Then try to urinate again.  For causes involving nerve dysfunction, keep a log of the medicines you take and a journal of the times you go to the bathroom. Contact a health care provider if:  You experience worsening of pain instead of improvement in pain after your procedure.  Your incontinence becomes worse instead of better. Get help right away if:  You experience fever or shaking chills.  You are unable to   pass your urine.  You have redness spreading into your groin or down into your thighs. This information is not intended to replace advice given to you by your health care provider. Make sure you discuss any questions you have  with your health care provider. Document Released: 12/04/2004 Document Revised: 06/06/2016 Document Reviewed: 04/05/2013 Elsevier Interactive Patient Education  2018 [employer]vier Inc.  

## 2017-04-18 LAB — COMPREHENSIVE METABOLIC PANEL
ALT: 16 IU/L (ref 0–32)
AST: 16 IU/L (ref 0–40)
Albumin/Globulin Ratio: 2 (ref 1.2–2.2)
Albumin: 4.7 g/dL (ref 3.5–5.5)
Alkaline Phosphatase: 71 IU/L (ref 39–117)
BUN/Creatinine Ratio: 10 (ref 9–23)
BUN: 7 mg/dL (ref 6–24)
Bilirubin Total: 0.3 mg/dL (ref 0.0–1.2)
CALCIUM: 9.3 mg/dL (ref 8.7–10.2)
CO2: 23 mmol/L (ref 18–29)
CREATININE: 0.68 mg/dL (ref 0.57–1.00)
Chloride: 100 mmol/L (ref 96–106)
GFR calc Af Amer: 121 mL/min/{1.73_m2} (ref >59)
GFR, EST NON AFRICAN AMERICAN: 105 mL/min/{1.73_m2} (ref >59)
Globulin, Total: 2.4 g/dL (ref 1.5–4.5)
Glucose: 95 mg/dL (ref 65–99)
Potassium: 4.4 mmol/L (ref 3.5–5.2)
Sodium: 136 mmol/L (ref 134–144)
TOTAL PROTEIN: 7.1 g/dL (ref 6.0–8.5)

## 2017-04-18 LAB — CBC
HEMATOCRIT: 36.5 % (ref 34.0–46.6)
HEMOGLOBIN: 12 g/dL (ref 11.1–15.9)
MCH: 27.3 pg (ref 26.6–33.0)
MCHC: 32.9 g/dL (ref 31.5–35.7)
MCV: 83 fL (ref 79–97)
Platelets: 401 10*3/uL — ABNORMAL HIGH (ref 150–379)
RBC: 4.4 x10E6/uL (ref 3.77–5.28)
RDW: 13.8 % (ref 12.3–15.4)
WBC: 7.9 10*3/uL (ref 3.4–10.8)

## 2017-04-18 LAB — LIPID PANEL
CHOL/HDL RATIO: 3.2 ratio (ref 0.0–4.4)
CHOLESTEROL TOTAL: 224 mg/dL — AB (ref 100–199)
HDL: 69 mg/dL (ref >39)
LDL Calculated: 133 mg/dL — ABNORMAL HIGH (ref 0–99)
TRIGLYCERIDES: 112 mg/dL (ref 0–149)
VLDL Cholesterol Cal: 22 mg/dL (ref 5–40)

## 2017-04-18 LAB — HEP, RPR, HIV PANEL
HEP B S AG: NEGATIVE
HIV SCREEN 4TH GENERATION: NONREACTIVE
RPR: NONREACTIVE

## 2017-04-18 LAB — HEMOGLOBIN A1C
ESTIMATED AVERAGE GLUCOSE: 108 mg/dL
Hgb A1c MFr Bld: 5.4 % (ref 4.8–5.6)

## 2017-04-18 LAB — URINALYSIS, MICROSCOPIC ONLY
CASTS: NONE SEEN /LPF
Epithelial Cells (non renal): NONE SEEN /hpf (ref 0–10)
RBC, UA: NONE SEEN /hpf (ref 0–?)
WBC, UA: NONE SEEN /hpf (ref 0–?)

## 2017-04-18 LAB — VITAMIN D 25 HYDROXY (VIT D DEFICIENCY, FRACTURES): VIT D 25 HYDROXY: 50.3 ng/mL (ref 30.0–100.0)

## 2017-04-18 LAB — URINE CULTURE: ORGANISM ID, BACTERIA: NO GROWTH

## 2017-04-18 LAB — HEPATITIS C ANTIBODY: Hep C Virus Ab: <0.1 s/co ratio (ref 0.0–0.9)

## 2017-06-10 DIAGNOSIS — A599 Trichomoniasis, unspecified: Secondary | ICD-10-CM

## 2017-06-10 HISTORY — DX: Trichomoniasis, unspecified: A59.9

## 2017-07-08 ENCOUNTER — Ambulatory Visit (INDEPENDENT_AMBULATORY_CARE_PROVIDER_SITE_OTHER): Payer: Commercial Managed Care - PPO | Admitting: Obstetrics and Gynecology

## 2017-07-08 ENCOUNTER — Other Ambulatory Visit (HOSPITAL_COMMUNITY)
Admission: RE | Admit: 2017-07-08 | Discharge: 2017-07-08 | Disposition: A | Discharge status: Home / Self Care | Payer: Commercial Managed Care - PPO | Source: Ambulatory Visit | Attending: Obstetrics and Gynecology | Admitting: Obstetrics and Gynecology

## 2017-07-08 ENCOUNTER — Encounter: Payer: Self-pay | Admitting: Obstetrics and Gynecology

## 2017-07-08 VITALS — BP 108/60 | HR 72 | Resp 16 | Wt 163.0 lb

## 2017-07-08 DIAGNOSIS — Z7251 High risk heterosexual behavior: Secondary | ICD-10-CM | POA: Diagnosis not present

## 2017-07-08 DIAGNOSIS — N760 Acute vaginitis: Secondary | ICD-10-CM | POA: Diagnosis not present

## 2017-07-08 DIAGNOSIS — Z113 Encounter for screening for infections with a predominantly sexual mode of transmission: Secondary | ICD-10-CM

## 2017-07-08 DIAGNOSIS — R300 Dysuria: Secondary | ICD-10-CM

## 2017-07-08 DIAGNOSIS — A5901 Trichomonal vulvovaginitis: Secondary | ICD-10-CM | POA: Diagnosis not present
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
## 2017-07-09 LAB — HEPATITIS C ANTIBODY: Hep C Virus Ab: 0.1 s/co ratio (ref 0.0–0.9)

## 2017-07-09 LAB — HEP, RPR, HIV PANEL
HIV Screen 4th Generation wRfx: NONREACTIVE
Hepatitis B Surface Ag: NEGATIVE
RPR: NONREACTIVE

## 2017-07-09 LAB — URINALYSIS, MICROSCOPIC ONLY: Casts: NONE SEEN /lpf

## 2017-07-09 LAB — CERVICOVAGINAL ANCILLARY ONLY
BACTERIAL VAGINITIS: NEGATIVE
CANDIDA VAGINITIS: NEGATIVE
CHLAMYDIA, DNA PROBE: NEGATIVE
NEISSERIA GONORRHEA: NEGATIVE
TRICH (WINDOWPATH): POSITIVE — AB

## 2017-07-10 ENCOUNTER — Telehealth: Payer: Self-pay | Admitting: *Deleted

## 2017-07-10 LAB — URINE CULTURE

## 2017-07-10 MED ORDER — METRONIDAZOLE 500 MG PO TABS
2000.0000 mg | ORAL_TABLET | Freq: Once | ORAL | 0 refills | Status: AC
Start: 2017-07-10 — End: 2017-07-10

## 2017-07-10 NOTE — Telephone Encounter (Signed)
Notes recorded by Burnice Logan, RN on 07/10/2017 at 1:21 PM EDT Left message to call Sharee Pimple at [phone]. See telephone encounter dated 07/10/17.  ------  Notes recorded by Nunzio Cobbs, MD on 07/10/2017 at 5:59 AM EDT Please report STD testing to patient which shows trichomonas.  I am recommending Flagyl 500 mg, take 4 tablets (2 grams) orally once.  Please send to pharmacy of choice.  ETOH precautions.   Testing is negative for HIV, syphilis, hepatitis B and C, gonorrhea, chlamydia, bacterial vaginosis, and yeast.  Final UC is pending.

## 2017-07-10 NOTE — Telephone Encounter (Signed)
Spoke with patient, advised as seen below per Dr. Quincy Simmonds. Rx for Flagyl to verified pharmacy on file. ETOH precautions reviewed. Patient scheduled for [facility] appointment on 08/04/17 at 11am with Dr. Quincy Simmonds. Patient verbalizes understanding and is agreeable.   Routing to provider for final review. Patient is agreeable to disposition. Will close encounter.

## 2017-07-11 ENCOUNTER — Encounter: Payer: Self-pay | Admitting: Obstetrics and Gynecology

## 2017-08-04 ENCOUNTER — Encounter: Payer: Self-pay | Admitting: Obstetrics and Gynecology

## 2017-08-04 ENCOUNTER — Ambulatory Visit (INDEPENDENT_AMBULATORY_CARE_PROVIDER_SITE_OTHER): Payer: PRIVATE HEALTH INSURANCE | Admitting: Obstetrics and Gynecology

## 2017-08-04 VITALS — BP 112/76 | HR 80 | Resp 16 | Wt 161.6 lb

## 2017-08-04 DIAGNOSIS — R140 Abdominal distension (gaseous): Secondary | ICD-10-CM | POA: Diagnosis not present

## 2017-08-04 DIAGNOSIS — N898 Other specified noninflammatory disorders of vagina: Secondary | ICD-10-CM | POA: Diagnosis not present

## 2017-08-04 DIAGNOSIS — A599 Trichomoniasis, unspecified: Secondary | ICD-10-CM

## 2017-08-04 NOTE — Progress Notes (Signed)
GYNECOLOGY  VISIT   HPI: 46 y.o.   Divorced  Caucasian  female   [phone] with Patient's last menstrual period was 04/22/2017.   here for  [facility] trich.  Took Flagyl 2 grams.    Discharge resolved.  Notes odor.  No itching.   Did full STD testing at her last visit.   Still having bloating.  She will see her PCP this week and ask for referral to GI.   Feels like her abdomen is bloated even though she is loosing weight.  Some diarrhea and constipation.  Status post cholecystectomy.   GYNECOLOGIC HISTORY: Patient's last menstrual period was 04/22/2017. Contraception:  condoms Menopausal hormone therapy:  n/a Last mammogram:  07/23/16 Left Breast MMG -- density c; no evidence of malignancy Last pap smear:   04/10/17 Pap and HR HPV negative              04/04/16 Pap shows LGSIL with Negative HR HPV -- colposcopy ECC benign and Cx Bx CIN I. 04/05/15 Pap shows LGSIL with Negative HR HPV -- colposcopy ECC--Neg, Cx Bx CIN I. 02/17/14 Pap and HR HPV negative        OB History    Gravida Para Term Preterm AB Living   4 1 1   3 1    SAB TAB Ectopic Multiple Live Births   3       1         Patient Active Problem List   Diagnosis Date Noted  . EXUDATIVE PHARYNGITIS 07/19/2008    Past Medical History:  Diagnosis Date  . Abnormal Pap smear of cervix 04-05-15   LGSIL/Neg HR HPV  . Abnormal uterine bleeding 2000   fibroids  . Anemia    past h/o anemia  . 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 Fibroid 2000  . Hypertension   . Migraine, unspecified migraine type    --complex  . Migraines   . 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 Trichomonas infection 06/2017  . Vitamin D deficiency 06/11/15    Past Surgical History:  Procedure Laterality Date  . APPENDECTOMY  1977  . BREAST SURGERY Left 2000   lumpectomy  . CHOLECYSTECTOMY  10/2015    --[facility]  . GYNECOLOGIC CRYOSURGERY  1996  . papilloma removal of left breast Left   . UTERINE ARTERY EMBOLIZATION  2011    Current Outpatient Prescriptions  Medication Sig Dispense Refill  . Acetaminophen (TYLENOL PO) Take by mouth as needed.    . Calcium-Magnesium-Vitamin D (CALCIUM MAGNESIUM PO) Take by mouth daily.    . Cholecalciferol (VITAMIN D3) 5000 units CAPS Take 1 capsule by mouth every other day.    . losartan (COZAAR) 50 MG tablet Take 50 mg by mouth daily.    . meloxicam (MOBIC) 15 MG tablet Take 15 mg by mouth as needed.    . Multiple Vitamin (MULTI-VITAMINS) TABS Take by mouth daily.    . SUMAtriptan (IMITREX) 25 MG tablet Take 25 mg by mouth every 2 (two) hours as needed for migraine or headache. May repeat in 2 hours if headache persists or recurs.     No current facility-administered medications for this visit.      ALLERGIES: Tramadol  Family History  Problem Relation Age of Onset  . Osteoarthritis Mother   . Asthma Mother   . Cancer  Mother        skin cancer  . Hyperlipidemia Mother   . Hypertension Mother   . Migraines Mother   . Stroke Mother   . Hyperlipidemia Father   . Hypertension Father   . Diabetes Father 78       pre diabetic  . Diabetes Sister 56       now insukin pump  . Breast cancer Maternal Grandmother   . Heart failure Maternal Grandmother     Social History   Social History  . Marital status: Divorced    Spouse name: N/A  . Number of children: N/A  . Years of education: N/A   Occupational History  . Not on file.   Social History Main Topics  . Smoking status: Never Smoker  . Smokeless tobacco: Never Used  . Alcohol use 0.0 oz/week     Comment: 1-2 glasses of wine/month  . Drug use: No  . Sexual activity: Yes    Partners: Male    Birth control/ protection: Condom     Comment: condoms everytime   Other Topics Concern  . Not on file   Social History Narrative  . No narrative on file    ROS:   Pertinent items are noted in HPI.  PHYSICAL EXAMINATION:    BP 112/76 (BP Location: Right Arm, Patient Position: Sitting, Cuff Size: Large)   Pulse 80   Resp 16   Wt 161 lb 9.6 oz (73.3 kg)   LMP 04/22/2017   BMI 30.04 kg/m     General appearance: alert, cooperative and appears stated age   Pelvic: External genitalia:  no lesions              Urethra:  normal appearing urethra with no masses, tenderness or lesions              Bartholins and Skenes: normal                 Vagina: normal appearing vagina with normal color and discharge, no lesions              Cervix: no lesions                Bimanual Exam:  Uterus:  normal size, contour, position, consistency, mobility, non-tender              Adnexa: no mass, fullness, tenderness            Chaperone was present for exam.  ASSESSMENT  Recent trichomonas.  Vaginal odor.  Abdominal bloating and weight loss.   PLAN  Affirm testing.  Return for pelvic ultrasound to check ovaries.    An After Visit Summary was printed and given to the patient.  ____15__ minutes face to face time of which over 50% was spent in counseling.

## 2017-08-04 NOTE — Progress Notes (Signed)
Following appointment with Dr Quincy Simmonds, pelvic ultrasound scheduled for August 20, 2017 at 1030 here in office. patient declined appointments offered this week due to work schedule and will be out of town next week.

## 2017-08-05 LAB — VAGINITIS/VAGINOSIS, DNA PROBE
Candida Species: NEGATIVE
GARDNERELLA VAGINALIS: NEGATIVE
TRICHOMONAS VAG: NEGATIVE

## 2017-08-20 ENCOUNTER — Encounter: Payer: Self-pay | Admitting: Obstetrics and Gynecology

## 2017-08-20 ENCOUNTER — Ambulatory Visit (INDEPENDENT_AMBULATORY_CARE_PROVIDER_SITE_OTHER): Payer: PRIVATE HEALTH INSURANCE

## 2017-08-20 ENCOUNTER — Ambulatory Visit (INDEPENDENT_AMBULATORY_CARE_PROVIDER_SITE_OTHER): Payer: PRIVATE HEALTH INSURANCE | Admitting: Obstetrics and Gynecology

## 2017-08-20 VITALS — BP 124/82 | HR 76 | Ht 61.5 in | Wt 161.0 lb

## 2017-08-20 DIAGNOSIS — K5900 Constipation, unspecified: Secondary | ICD-10-CM | POA: Diagnosis not present

## 2017-08-20 DIAGNOSIS — N83201 Unspecified ovarian cyst, right side: Secondary | ICD-10-CM | POA: Diagnosis not present

## 2017-08-20 DIAGNOSIS — Z1211 Encounter for screening for malignant neoplasm of colon: Secondary | ICD-10-CM | POA: Diagnosis not present

## 2017-08-20 DIAGNOSIS — R140 Abdominal distension (gaseous): Secondary | ICD-10-CM

## 2017-08-20 NOTE — Progress Notes (Signed)
Patient ID: Karen Avila, female   DOB: 28-Feb-1971, 46 y.o.   MRN: [id number] GYNECOLOGY  VISIT   HPI: 46 y.o.   Divorced  Caucasian  female   [phone] with Patient's last menstrual period was 04/22/2017 (exact date).   here for pelvic ultrasound to check ovaries due to abdominal bloating and weight loss.    ROS - heat/cold intolerance, headache, abdominal pain/bloating/constipation, right elbow pain, and hair loss.   States she took a laxative last weekend and had little response.  Abdominal pain is low and can be either side. Does have pain in the rectum and lower abdomen with bowel movement every time. Stool is dark brown and dried/firm.  No tarry stools.  No prior colonoscopy.   GYNECOLOGIC HISTORY: Patient's last menstrual period was 04/22/2017 (exact date). Contraception:  condoms Menopausal hormone therapy:  n/a Last mammogram:  07/23/16 Left Breast MMG -- density c; no evidence of malignancy Last pap smear:  04/10/17 Pap and HR HPV negative 04/04/16 Pap shows LGSIL with Negative HR HPV -- colposcopy ECC benign and Cx Bx CIN I. 04/05/15 Pap shows LGSIL with Negative HR HPV -- colposcopy ECC--Neg, Cx Bx CIN I. 02/17/14 Pap and HR HPV negative                OB History    Gravida Para Term Preterm AB Living   4 1 1   3 1    SAB TAB Ectopic Multiple Live Births   3       1         Patient Active Problem List   Diagnosis Date Noted  . EXUDATIVE PHARYNGITIS 07/19/2008    Past Medical History:  Diagnosis Date  . Abnormal Pap smear of cervix 04-05-15   LGSIL/Neg HR HPV  . Abnormal uterine bleeding 2000   fibroids  . Anemia    past h/o anemia  . Cancer ([place]) 1995   pre cancerous skin cells Lt medial calf  . 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 Fibroid 2000  . Hypertension   . Migraine, unspecified migraine type    --complex  . Migraines   . Mild dysplasia of cervix (CIN I) 2015, 2016   colposcopic  biopsies  . Trichomonas infection 06/2017  . Vitamin D deficiency 06/11/15    Past Surgical History:  Procedure Laterality Date  . APPENDECTOMY  1977  . BREAST SURGERY Left 2000   lumpectomy  . CHOLECYSTECTOMY  10/2015   --[facility]  . GYNECOLOGIC CRYOSURGERY  1996  . papilloma removal of left breast Left   . UTERINE ARTERY EMBOLIZATION  2011    Current Outpatient Prescriptions  Medication Sig Dispense Refill  . Acetaminophen (TYLENOL PO) Take by mouth as needed.    . Calcium-Magnesium-Vitamin D (CALCIUM MAGNESIUM PO) Take by mouth daily.    . Cholecalciferol (VITAMIN D3) 5000 units CAPS Take 1 capsule by mouth every other day.    . losartan (COZAAR) 50 MG tablet Take 50 mg by mouth daily.    . Multiple Vitamin (MULTI-VITAMINS) TABS Take by mouth daily.    . SUMAtriptan (IMITREX) 25 MG tablet Take 25 mg by mouth every 2 (two) hours as needed for migraine or headache. May repeat in 2 hours if headache persists or recurs.    . vitamin E 200 UNIT capsule Take 1 capsule by mouth daily.     No current facility-administered medications for this visit.      ALLERGIES: Tramadol  Family History  Problem Relation Age of Onset  . Osteoarthritis Mother   . Asthma Mother   . Cancer Mother        skin cancer  . Hyperlipidemia Mother   . Hypertension Mother   . Migraines Mother   . Stroke Mother   . Hyperlipidemia Father   . Hypertension Father   . Diabetes Father 62       pre diabetic  . Diabetes Sister 77       now insukin pump  . Breast cancer Maternal Grandmother   . Heart failure Maternal Grandmother     Social History   Social History  . Marital status: Divorced    Spouse name: N/A  . Number of children: N/A  . Years of education: N/A   Occupational History  . Not on file.   Social History Main Topics  . Smoking status: Never Smoker  . Smokeless tobacco: Never Used  . Alcohol use 0.0 oz/week     Comment: 1-2 glasses of wine/month  . Drug use: No  .  Sexual activity: Yes    Partners: Male    Birth control/ protection: Condom     Comment: condoms everytime   Other Topics Concern  . Not on file   Social History Narrative  . No narrative on file    ROS:  Pertinent items are noted in HPI.  PHYSICAL EXAMINATION:    BP 124/82 (BP Location: Right Arm, Patient Position: Sitting, Cuff Size: Normal)   Pulse 76   Ht 5' 1.5" (1.562 m)   Wt 161 lb (73 kg)   LMP 04/22/2017 (Exact Date)   BMI 29.93 kg/m     General appearance: alert, cooperative and appears stated age   Pelvic US: Uterus with 2 fibroids - 13 mm and 20 mm intramural fibroids. EMS 3.51 mm. 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  Left ovary normal.  No free fluid.  ASSESSMENT  Complex right ovarian cyst.  Small fibroids. Abdominal pain, bloating, and constipation. Weight loss. Need for colon cancer screening.   PLAN  Discussion of ovarian cysts and focused on possible teratoma.  Check CA125 and CEA.   We did talk about the limitations of the CA125 and false positives in premenopausal women.  We discussed potential laparoscopic right oophorectomy and bilateral salpingectomy for a persistent ovarian cyst if it does not resolve.   She did inquire about hysterectomy at the same time. We will at least do a follow up US in 6 weeks.  I am referring her to GI for colonoscopy.   An After Visit Summary was printed and given to the patient.  __15____ minutes face to face time of which over 50% was spent in counseling.

## 2017-08-20 NOTE — Progress Notes (Signed)
Encounter reviewed by Dr. Brook Amundson C. Silva.  

## 2017-08-21 LAB — CA 125: Cancer Antigen (CA) 125: 25.9 U/mL (ref 0.0–38.1)

## 2017-08-21 LAB — CEA: CEA: 3 ng/mL (ref 0.0–4.7)

## 2017-08-24 NOTE — Telephone Encounter (Signed)
Pelvic ultrasound completed on 08-20-17. Have contacted Ivin Booty (sonographer) to see if this CD can be provided after scan completed.  Routing to provider for final review. Will close encounter.

## 2017-08-25 ENCOUNTER — Ambulatory Visit (INDEPENDENT_AMBULATORY_CARE_PROVIDER_SITE_OTHER): Payer: PRIVATE HEALTH INSURANCE | Admitting: Gastroenterology

## 2017-08-25 ENCOUNTER — Telehealth: Payer: Self-pay | Admitting: *Deleted

## 2017-08-25 ENCOUNTER — Encounter: Payer: Self-pay | Admitting: Gastroenterology

## 2017-08-25 VITALS — BP 130/84 | HR 76 | Ht 61.75 in | Wt 163.5 lb

## 2017-08-25 DIAGNOSIS — K5900 Constipation, unspecified: Secondary | ICD-10-CM | POA: Diagnosis not present

## 2017-08-25 NOTE — Progress Notes (Signed)
Karen Avila    [id number]    July 03, 1971  Primary Care Physician:Everly, Barbarann Ehlers, DO  Referring Physician: Nunzio Cobbs, MD [address] 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 Patient complains of worsening constipation with bloating. She is having bowel movements 3-4 times a week, passes hard pellets on most days. She started taking senna over-the-counter last weekend with no significant improvement. She forgets to drink water during her work hours. She is trying to eat healthy fruits and vegetables. Transvaginal ultrasound showed enlarged uterus with fibroids, right ovarian enlargement 31 x 19 x 26 mm, malignant markers were negative. She has follow-up ultrasound in 2 months.  Patient is worried if the ovarian tumor is preventing her from evacuating her bowels.  Denies any nausea, vomiting, abdominal pain, melena or bright red blood per rectum    Outpatient Encounter Prescriptions as of 08/25/2017  Medication Sig  . Acetaminophen (TYLENOL PO) Take by mouth as needed.  . Calcium-Magnesium-Vitamin D (CALCIUM MAGNESIUM PO) Take by mouth daily.  . Cholecalciferol (VITAMIN D3) 5000 units CAPS Take 1 capsule by mouth every other day.  . losartan (COZAAR) 50 MG tablet Take 50 mg by mouth daily.  . Multiple Vitamin (MULTI-VITAMINS) TABS Take by mouth daily.  . SUMAtriptan (IMITREX) 25 MG tablet Take 25 mg by mouth every 2 (two) hours as needed for migraine or headache. May repeat in 2 hours if headache persists or recurs.  . vitamin E 200 UNIT capsule Take 1 capsule by mouth daily.   No facility-administered encounter medications on file as of 08/25/2017.     Allergies as of 08/25/2017 - Review Complete 08/25/2017  Allergen Reaction Noted  . Tramadol  09/30/2013    Past Medical History:  Diagnosis Date  . Abnormal Pap smear of cervix  04-05-15   LGSIL/Neg HR HPV  . Abnormal uterine bleeding 2000   fibroids  . Anemia    past h/o anemia  . Cancer ([place]) 1995   pre cancerous skin cells Lt medial calf  . 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 Depression   . Fibroid 2000  . Gallstones   . GERD (gastroesophageal reflux disease)   . HLD (hyperlipidemia)   . Hypertension   . Migraine, unspecified migraine type    --complex  . 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 Pneumonia   . Sleep apnea   . Trichomonas infection 06/2017  . Vitamin D deficiency 06/11/15    Past Surgical History:  Procedure Laterality Date  . APPENDECTOMY  1977  . BREAST LUMPECTOMY Left 2000   lumpectomy  . CHOLECYSTECTOMY  10/2015   --[facility]  . CRANIECTOMY  2002  . GYNECOLOGIC CRYOSURGERY  1996  . UTERINE ARTERY EMBOLIZATION  2011    Family History  Problem Relation Age of Onset  . Osteoarthritis Mother   . Asthma Mother   . Hyperlipidemia Mother   . Hypertension Mother   . Migraines Mother   . Stroke Mother   . Skin cancer Mother   . Colon polyps Mother   . Irritable bowel syndrome Mother   . Hyperlipidemia Father   . Hypertension Father   . Diabetes Father 88       pre diabetic  . Diabetes Sister 68  now insukin pump  . Breast cancer Maternal Grandmother   . Heart failure Maternal Grandmother     Social History   Social History  . Marital status: Divorced    Spouse name: N/A  . Number of children: 1  . Years of education: N/A   Occupational History  . Not on file.   Social History Main Topics  . Smoking status: Never Smoker  . Smokeless tobacco: Never Used  . Alcohol use 0.0 oz/week     Comment: 1-2 glasses of wine/month  . Drug use: No  . Sexual activity: Yes    Partners: Male    Birth control/ protection: Condom     Comment: condoms everytime   Other Topics Concern  . Not on file   Social History Narrative  . No narrative on  file      Review of systems: Review of Systems  Constitutional: Negative for fever and chills.  HENT: Negative.   Eyes: Negative for blurred vision.  Respiratory: Negative for cough, shortness of breath and wheezing.   Cardiovascular: Negative for chest pain and palpitations.  Gastrointestinal: as per HPI Genitourinary: Negative for dysuria, urgency, frequency and hematuria.  Musculoskeletal: Negative for myalgias, back pain and joint pain.  Skin: Negative for itching and rash.  Neurological: Negative for dizziness, tremors, focal weakness, seizures and loss of consciousness.  Endo/Heme/Allergies: Positive for seasonal allergies.  Psychiatric/Behavioral: Negative for depression, suicidal ideas and hallucinations.  All other systems reviewed and are negative.   Physical Exam: Vitals:   08/25/17 0908  BP: 130/84  Pulse: 76   Body mass index is 30.15 kg/m. Gen:      No acute distress HEENT:  EOMI, sclera anicteric Neck:     No masses; no thyromegaly Lungs:    Clear to auscultation bilaterally; normal respiratory effort CV:         Regular rate and rhythm; no murmurs Abd:      + bowel sounds; soft, non-tender; no palpable masses, no distension Ext:    No edema; adequate peripheral perfusion Skin:      Warm and dry; no rash Neuro: alert and oriented x 3 Psych: normal mood and affect  Data Reviewed:  Reviewed labs, radiology imaging, old records and pertinent past GI work up  Assessment and Plan/Recommendations:  46 year old female with right ovarian enlargement, uterine fibroids here with complaints of worsening constipation Advised patient to increase dietary fiber and fluid intake 8-10 cups of water daily Start Benefiber or any soluble fiber 2-3 times daily with meals MiraLAX half capful at bedtime, titrate to have one to 2 soft bowel movements daily Return in 1-2 months, if no improvement we'll consider further workup including colonoscopy and +/- anorectal  manometry    Raliegh Ip Denzil Magnuson , MD [phone] Mon-Fri 8a-5p [phone] after 5p, weekends, holidays  CC: Yisroel Ramming, [place]*

## 2017-08-25 NOTE — Patient Instructions (Signed)
Take Miralax 1/2 capful daily at bedtime   Take benefiber or fiber gummies daily    About Constipation  Constipation Overview Constipation is the most common gastrointestinal complaint - about 4 million Americans experience constipation and make 2.5 million physician visits a year to get help for the problem.  Constipation can occur when the colon absorbs too much water, the colon's muscle contraction is slow or sluggish, and/or there is delayed transit time through the colon.  The result is stool that is hard and dry.  Indicators of constipation include straining during bowel movements greater than 25% of the time, having fewer than three bowel movements per week, and/or the feeling of incomplete evacuation.  There are established guidelines ([place] II ) for defining constipation. A person needs to have two or more of the following symptoms for at least 12 weeks (not necessarily consecutive) in the preceding 12 months: . Straining in  greater than 25% of bowel movements . Lumpy or hard stools in greater than 25% of bowel movements . Sensation of incomplete emptying in greater than 25% of bowel movements . Sensation of anorectal obstruction/blockade in greater than 25% of bowel movements . Manual maneuvers to help empty greater than 25% of bowel movements (e.g., digital evacuation, support of the pelvic floor)  . Less than  3 bowel movements/week . Loose stools are not present, and criteria for irritable bowel syndrome are insufficient  Common Causes of Constipation . Lack of fiber in your diet . Lack of physical activity . Medications, including iron and calcium supplements  . Dairy intake . Dehydration . Abuse of laxatives  Travel  Irritable Bowel Syndrome  Pregnancy  Luteal phase of menstruation (after ovulation and before menses)  Colorectal problems  Intestinal Dysfunction  Treating Constipation  There are several ways of treating constipation, including changes to diet  and exercise, use of laxatives, adjustments to the pelvic floor, and scheduled toileting.  These treatments include: . increasing fiber and fluids in the diet  . increasing physical activity . learning muscle coordination   learning proper toileting techniques and toileting modifications   designing and sticking  to a toileting schedule     2007, [employer] Doc.22

## 2017-08-25 NOTE — Telephone Encounter (Signed)
Call to patient. Patient previously requested copy of pelvic ultrasound via My Chart message. Advised patient CD is ready for pick-up.   Routing to provider for final review. Patient agreeable to disposition. Will close encounter.

## 2017-09-07 ENCOUNTER — Encounter: Payer: Self-pay | Admitting: Obstetrics and Gynecology

## 2017-09-09 ENCOUNTER — Telehealth: Payer: Self-pay

## 2017-09-09 NOTE — Telephone Encounter (Signed)
New order faxed to [facility] at [phone] with all included information needed. Patient notified via [place]. Encounter closed.

## 2017-09-09 NOTE — Telephone Encounter (Signed)
I can schedule it. But the scheduler says the order needs to be for transabdominal and transvaginal pelvic ultrasound with duplex, and has to include the exam CPT codes: 87564 (transabd), 33295 (transvag), 93975 (Doppler).  The hospital does transabd with all pelvic ultrasounds.    Thank you,  Caren Griffins    ----- Message -----  From: Nurse Naaman Plummer  Sent: 09/09/17 2:34 PM  To: Santina Evans  Subject: Ultrasound    Caren Griffins,    We have faxed an order for your ultrasound to [facility]. Do you need assistance with scheduling?    Reesa Chew, RN   New order written and to Dr.Silva.

## 2017-09-09 NOTE — Telephone Encounter (Signed)
Please see telephone encounter dated with today's date. 

## 2017-09-09 NOTE — Telephone Encounter (Signed)
Non-Urgent Medical Question  Message [id number]  From MAKELA NIEHOFF To Nunzio Cobbs, MD Sent 09/07/2017 11:16 AM  Good morning Dr Quincy Simmonds.   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 With the proper prep (which I hadn't done scanning myself)...the mass in my ovary actually shows up very well transabdominally, and our machine detected clear arterial and venous flow within the mass. Prominent vascularity changes the differentials significantly. In light of the improved sensitivity of our machine, I would really like to have my follow up ultrasound at [facility]. Can you put an order in Epic for them?   Thank you,  Besse   Responsible Party   Pool - [facility] Clinical Pool No one has taken responsibility for this message.  No actions have been taken on this message.   Dr.Silva, please advise. Okay to place order?

## 2017-09-09 NOTE — Telephone Encounter (Signed)
Ok to place order and place in imaging hold.

## 2017-09-09 NOTE — Telephone Encounter (Signed)
New ultrasound order signed.

## 2017-09-09 NOTE — Telephone Encounter (Signed)
Order faxed to [facility] at [phone]. Patient notified via [place]. Encounter closed.

## 2017-09-09 NOTE — Telephone Encounter (Signed)
Spoke with patient. Advised unable to place order through [facility]. Will have to fax order. [facility] imaging department scheduling's fax is [phone]. Order to [place] for review before faxing. Patient requests MyChart message be sent to her once this is done.

## 2017-09-09 NOTE — Telephone Encounter (Signed)
I signed the order to do the pelvic US at [facility].

## 2017-09-11 ENCOUNTER — Encounter: Payer: Self-pay | Admitting: Obstetrics and Gynecology

## 2017-09-26 ENCOUNTER — Encounter: Payer: Self-pay | Admitting: Obstetrics and Gynecology

## 2017-09-28 ENCOUNTER — Telehealth: Payer: Self-pay | Admitting: Obstetrics and Gynecology

## 2017-09-28 NOTE — Telephone Encounter (Addendum)
Patient canceled her upcoming ultrasound 10/08/17. Patient states she doe snot need the ultrasound any longer. Patient would like to reschedule the 6 week recheck appointment and just cancel the PUS. Patient states she had a PUS done 09/25/17 and would like to go over the results with Dr.Silva. (patient sent a MyChart message to Dr.Silva on Saturday 09/26/17)

## 2017-09-28 NOTE — Telephone Encounter (Signed)
See telephone encounter dated 09/28/17.

## 2017-09-28 NOTE — Telephone Encounter (Signed)
Spoke with patient, advised copy of ultrasound received, has not been reviewed by Dr. Quincy Simmonds. OV scheduled for 10/07/17 at 3:30pm with Dr. Quincy Simmonds. Advised will return call with any additional recommendations. Patient is agreeable.   Copy of results to Dr. Quincy Simmonds.   Dr. Quincy Simmonds -any additional recommendations?     From Karen Avila To Karen Cobbs, MD Sent 09/26/2017 4:33 PM  Hi Dr Quincy Simmonds,   I hope you're having a good day.   I had my ultrasound yesterday, and it appears the mass has grown, and the sonographer (and I) thinks there is another mass on the right ovary as well. I haven't seen the radiologists report yet, though. However, based on the growth of the mass, I would like to expedite surgery. Also, it seems I have some new fibroids, so I'd like to have the uterus removed too. My appointment to follow up with you on the ultrasound isn't until Nov 29th, but I'm going to call on Monday to hopefully get that moved up. I just wanted to make sure you had the report available. If not, I can have medical records print it for me on Monday when I get to work. I also had the sonographer burn a CD of the images so you can see what it looks like now.   Karen Avila

## 2017-09-29 ENCOUNTER — Encounter: Payer: Self-pay | Admitting: Obstetrics and Gynecology

## 2017-09-29 NOTE — Telephone Encounter (Signed)
Left message to call Alveria Mcglaughlin at [phone].  

## 2017-09-29 NOTE — Telephone Encounter (Signed)
I did review the pelvic ultrasound report from [facility]. It does look the the ovary has increased in size from her prior ultrasound.  Her CA125 was normal.  I will be happy to see her on 11/28 to discuss surgical care.  The radiologist suggested potential MRI to characterize the cyst further, but I am not certain that this is really beneficial at this juncture.

## 2017-09-30 ENCOUNTER — Ambulatory Visit: Payer: PRIVATE HEALTH INSURANCE | Admitting: Obstetrics and Gynecology

## 2017-09-30 NOTE — Telephone Encounter (Signed)
Results reviewed with patient. See MyChart encouter dated 09/29/17.   Routing to provider for final review.  Will close encounter.

## 2017-10-07 ENCOUNTER — Encounter: Payer: Self-pay | Admitting: Obstetrics and Gynecology

## 2017-10-07 ENCOUNTER — Ambulatory Visit: Payer: Self-pay | Admitting: Obstetrics and Gynecology

## 2017-10-07 ENCOUNTER — Encounter: Payer: Self-pay | Admitting: Gynecology

## 2017-10-07 ENCOUNTER — Ambulatory Visit (INDEPENDENT_AMBULATORY_CARE_PROVIDER_SITE_OTHER): Payer: PRIVATE HEALTH INSURANCE | Admitting: Obstetrics and Gynecology

## 2017-10-07 ENCOUNTER — Telehealth: Payer: Self-pay | Admitting: *Deleted

## 2017-10-07 ENCOUNTER — Telehealth: Payer: Self-pay | Admitting: Gynecology

## 2017-10-07 VITALS — BP 120/68 | HR 60 | Ht 61.5 in | Wt 161.6 lb

## 2017-10-07 DIAGNOSIS — N83201 Unspecified ovarian cyst, right side: Secondary | ICD-10-CM | POA: Diagnosis not present

## 2017-10-07 NOTE — Progress Notes (Signed)
GYNECOLOGY  VISIT   HPI: 46 y.o.   Divorced  Caucasian  female   [phone] with Patient's last menstrual period was 04/27/2017 (exact date).   here to review pelvic ultrasound from [facility] done 09-25-17--in Care Everywhere.   She wants surgery to remove her uterus and the right ovary.  Right ovary 4.8 x 3.6 x 3.8 cm with heterogenicity to it.  Patient is concerned about hypervascularity of the ovarian cyst.  (She is an ultrasound technician.) Uterus with fibroids .  EMB 6 mm. Left ovary normal.  No free fluid.   CA125 and CEA normal.  GYNECOLOGIC HISTORY: Patient's last menstrual period was 04/27/2017 (exact date). Contraception:  condoms Menopausal hormone therapy:  n/a Last mammogram: 09-11-17 Density C/Poss.mass Lt.Br.,Rt.Br.neg--further evaluation rec.. Diag.Lt.and Lt.Br.U/S revealing 7.4 mm simple cyst at 2 o'clock--screening back on schedule/[facility] Last pap smear:  04/10/17 Pap and HR HPV negative 04/04/16 Pap shows LGSIL with Negative HR HPV -- colposcopy ECC benign and Cx Bx CIN I. 04/05/15 Pap shows LGSIL with Negative HR HPV -- colposcopy ECC--Neg, Cx Bx CIN I. 02/17/14 Pap and HR HPV negative         OB History    Gravida Para Term Preterm AB Living   4 1 1   3 1    SAB TAB Ectopic Multiple Live Births   3       1         Patient Active Problem List   Diagnosis Date Noted  . EXUDATIVE PHARYNGITIS 07/19/2008    Past Medical History:  Diagnosis Date  . Abnormal Pap smear of cervix 04-05-15   LGSIL/Neg HR HPV  . Abnormal uterine bleeding 2000   fibroids  . Anemia    past h/o anemia  . Cancer ([place]) 1995   pre cancerous skin cells Lt medial calf  . 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 Depression   . Fibroid 2000  . Gallstones   . GERD (gastroesophageal reflux disease)   . HLD (hyperlipidemia)   . Hypertension   . Migraine, unspecified migraine type    --complex  .  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 Pneumonia   . Sleep apnea   . Trichomonas infection 06/2017  . Vitamin D deficiency 06/11/15    Past Surgical History:  Procedure Laterality Date  . APPENDECTOMY  1977  . BREAST LUMPECTOMY Left 2000   lumpectomy  . CHOLECYSTECTOMY  10/2015   --[facility]  . CRANIECTOMY  2002  . GYNECOLOGIC CRYOSURGERY  1996  . UTERINE ARTERY EMBOLIZATION  2011    Current Outpatient Medications  Medication Sig Dispense Refill  . Acetaminophen (TYLENOL PO) Take by mouth as needed.    . Calcium-Magnesium-Vitamin D (CALCIUM MAGNESIUM PO) Take by mouth daily.    Marland Kitchen EVENING PRIMROSE OIL PO Take by mouth. Takes 1300mg  bid    . losartan (COZAAR) 50 MG tablet Take 50 mg by mouth daily.    . Multiple Vitamin (MULTI-VITAMINS) TABS Take by mouth daily.    . SUMAtriptan (IMITREX) 25 MG tablet Take 25 mg by mouth every 2 (two) hours as needed for migraine or headache. May repeat in 2 hours if headache persists or recurs.    . vitamin E 200 UNIT capsule Take 1 capsule by mouth daily.     No current facility-administered medications for this visit.      ALLERGIES: Tramadol  Family History  Problem  Relation Age of Onset  . Osteoarthritis Mother   . Asthma Mother   . Hyperlipidemia Mother   . Hypertension Mother   . Migraines Mother   . Stroke Mother   . Skin cancer Mother   . Colon polyps Mother   . Irritable bowel syndrome Mother   . Hyperlipidemia Father   . Hypertension Father   . Diabetes Father 67       pre diabetic  . Diabetes Sister 48       now insukin pump  . Breast cancer Maternal Grandmother   . Heart failure Maternal Grandmother     Social History   Socioeconomic History  . Marital status: Divorced    Spouse name: Not on file  . Number of children: 1  . Years of education: Not on file  . Highest education level: Not on file  Social Needs  . Financial resource strain: Not on file  . Food insecurity  - worry: Not on file  . Food insecurity - inability: Not on file  . Transportation needs - medical: Not on file  . Transportation needs - non-medical: Not on file  Occupational History  . Not on file  Tobacco Use  . Smoking status: Never Smoker  . Smokeless tobacco: Never Used  Substance and Sexual Activity  . Alcohol use: Yes    Alcohol/week: 0.0 oz    Comment: 1-2 glasses of wine/month  . Drug use: No  . Sexual activity: Yes    Partners: Male    Birth control/protection: Condom    Comment: condoms everytime  Other Topics Concern  . Not on file  Social History Narrative  . Not on file    ROS:  Pertinent items are noted in HPI.  PHYSICAL EXAMINATION:    BP 120/68 (BP Location: Right Arm, Patient Position: Sitting, Cuff Size: Normal)   Pulse 60   Ht 5' 1.5" (1.562 m)   Wt 161 lb 9.6 oz (73.3 kg)   LMP 04/27/2017 (Exact Date)   BMI 30.04 kg/m     General appearance: alert, cooperative and appears stated age    ASSESSMENT  Right ovarian cyst/mass.  Fibroids. Status post uterine artery embolization. Hx cervical dysplasia.    PLAN  Will check tumor markers today.  We discussed laparoscopic hysterectomy with bilateral salpingectomy, right oophorectomy, and pelvic washings.  She is very concerned about an occult malignancy, and I am happy to refer her to [place].  Will set up consultation with Dr. Denman George.    An After Visit Summary was printed and given to the patient.  __15____ minutes face to face time of which over 50% was spent in counseling.

## 2017-10-07 NOTE — Telephone Encounter (Signed)
Call to patient. Advised of appointment scheduled with [facility]/ Gyn [facility], Dr Fermin Schwab for December 10 at 1215. Patient states she desires earlier appointment in hopes of surgery this year. Advised this may not be realistic goal at this point but sometimes if willing to travel to [facility], may still have dates availalble. She will call office to see if any cancellations.  Routing to provider for final review. Patient agreeable to disposition. Will close encounter.    Routing to Dr Talbert Nan covering for Dr Quincy Simmonds.

## 2017-10-07 NOTE — Telephone Encounter (Signed)
Received a call from Gay Filler to schedule the pt a gyn onc appt. Appt has been scheduled for the pt to see Dr. Fermin Schwab on 12/10 at 1215pm. Letter mailed.

## 2017-10-08 ENCOUNTER — Other Ambulatory Visit: Payer: PRIVATE HEALTH INSURANCE | Admitting: Obstetrics and Gynecology

## 2017-10-08 ENCOUNTER — Telehealth: Payer: Self-pay | Admitting: *Deleted

## 2017-10-08 ENCOUNTER — Other Ambulatory Visit: Payer: PRIVATE HEALTH INSURANCE

## 2017-10-08 DIAGNOSIS — N838 Other noninflammatory disorders of ovary, fallopian tube and broad ligament: Secondary | ICD-10-CM

## 2017-10-08 LAB — LACTATE DEHYDROGENASE: LDH: 163 IU/L (ref 119–226)

## 2017-10-08 LAB — AFP TUMOR MARKER: AFP, Serum, Tumor Marker: 2 ng/mL (ref 0.0–8.3)

## 2017-10-08 LAB — ESTRADIOL: Estradiol: 52.5 pg/mL

## 2017-10-08 LAB — BETA HCG QUANT (REF LAB): hCG Quant: 2 m[IU]/mL

## 2017-10-08 NOTE — Telephone Encounter (Signed)
Spoke with patient, advise referral placed via Epic for Dr. Claiborne Billings, will call their office directly in the morning to f/u with any additional referral information that may be needed. Patient ask if labs results have returned, advised will have Dr. Talbert Nan review, will return call on 11/30 to review and update on referral. Patient verbalizes understanding and is agreeable.   Dr. Talbert Nan  -please review labs dated 11/28 and advise?   Cc: Dr. Quincy Simmonds        From Santina Evans To Nunzio Cobbs, MD Sent 10/07/2017 5:39 PM  When the GYN oncology office called today with my appointment information, she told me it was unlikely I would have surgery before mid January. Which means... I would start over with a $3000 deductible. The deductible for [facility] would only be $1000. So please send a referral to Dr Rayford Halsted at [facility] (phone [phone], fax [phone]). I already have an appointment with them for Dec 12, and they might even be able to do the surgery before the end of the year.   I plan to meet with both doctors and hear what each has to say before making a decision as to where I will have the surgery. Money is a factor, but not the most important one. I need to feel comfortable with the physician and the surgical plan. Also, I'm anxious to get this thing out, especially since it changed so much in just 4 weeks.   Thank you,  Karen Avila

## 2017-10-08 NOTE — Telephone Encounter (Signed)
mychart message sent. See result note.

## 2017-10-08 NOTE — Telephone Encounter (Signed)
See telephone encounter dated 10/08/17.

## 2017-10-09 NOTE — Telephone Encounter (Signed)
Spoke with Karen Avila in referrals. Patient scheduled with Dr. Claiborne Billings on 10/21/17. Advised to fax copy of referral and OV notes to [phone]. Will make note can view labs and imaging via care everywhere. Will return call to office if any additional information is required.   Requested information faxed.

## 2017-10-09 NOTE — Telephone Encounter (Signed)
Spoke with patient, advised I have contacted Dr. Evette Georges office regarding referral. Labs have been reviewed by patient via [place]. Patient aware to return call to office with any additional questions/concerns.

## 2017-10-19 ENCOUNTER — Ambulatory Visit: Payer: PRIVATE HEALTH INSURANCE | Admitting: Gynecology

## 2017-10-23 ENCOUNTER — Ambulatory Visit: Payer: PRIVATE HEALTH INSURANCE | Admitting: Gynecology

## 2018-04-16 ENCOUNTER — Ambulatory Visit (INDEPENDENT_AMBULATORY_CARE_PROVIDER_SITE_OTHER): Payer: PRIVATE HEALTH INSURANCE | Admitting: Obstetrics and Gynecology

## 2018-04-16 ENCOUNTER — Other Ambulatory Visit: Payer: Self-pay

## 2018-04-16 ENCOUNTER — Encounter: Payer: Self-pay | Admitting: Obstetrics and Gynecology

## 2018-04-16 VITALS — BP 110/70 | HR 70 | Resp 16 | Ht 61.75 in | Wt 150.0 lb

## 2018-04-16 DIAGNOSIS — Z01419 Encounter for gynecological examination (general) (routine) without abnormal findings: Secondary | ICD-10-CM

## 2018-04-16 NOTE — Progress Notes (Signed)
47 y.o. [id number] Divorced Caucasian female here for annual exam.  Had iron infusions 12/31/17  Had robotic hysterectomy with right oophorectomy, bilateral salpingectomy at [facility].  States she had a pedunculated fibroid.  No longer has bloated feeling and pressure.   Started Prozac and feeling improved with depression and fatigue.  Feels better on this than the Effexor.  Still with hot flashes.  Doing herbal therapy and satisfied with this.   No new partners since her last visit.  Declines STD testing.   Her hematologist in [facility] is evaluating her anemia.  Had colonoscopy due to anemia.  Has done iron infusions.   Sees PCP for labs.   PCP:   [place].   Patient's last menstrual period was 04/27/2017 (exact date).           Sexually active: No.  The current method of family planning is status post hysterectomy.    Exercising: No.  exercise Smoker:  no  Health Maintenance: Pap:  04-04-16 LGSIL HPV HR neg, 04-10-17 neg HPV HR neg History of abnormal Pap:  Yes CIN1 MMG:  10-16-18bilateral & left breast category c density birads 2:neg Colonoscopy: 4/19.  Normal per patient.  BMD:   none   TDaP:  2016 HIV: neg 2018 Hep C: neg 2018 Screening Labs:    reports that she has never smoked. She has never used smokeless tobacco. She reports that she drinks alcohol. She reports that she does not use drugs.  Past Medical History:  Diagnosis Date  . Abnormal Pap smear of cervix 04-05-15   LGSIL/Neg HR HPV  . Abnormal uterine bleeding 2000   fibroids  . Anemia    past h/o anemia  . Cancer ([place]) 1995   pre cancerous skin cells Lt medial calf  . 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 Depression   . Fibroid 2000  . Gallstones   . GERD (gastroesophageal reflux disease)   . HLD (hyperlipidemia)   . Hypertension   . Migraine, unspecified migraine type    --complex  . 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  Pneumonia   . Sleep apnea   . Trichomonas infection 06/2017  . Vitamin D deficiency 06/11/15    Past Surgical History:  Procedure Laterality Date  . APPENDECTOMY  1977  . BREAST LUMPECTOMY Left 2000   lumpectomy  . CHOLECYSTECTOMY  10/2015   --[facility]  . CRANIECTOMY  2002  . GYNECOLOGIC CRYOSURGERY  1996  . UTERINE ARTERY EMBOLIZATION  2011    Current Outpatient Medications  Medication Sig Dispense Refill  . Acetaminophen (TYLENOL PO) Take by mouth as needed.    . baclofen (LIORESAL) 10 MG tablet   0  . Calcium-Magnesium-Vitamin D (CALCIUM MAGNESIUM PO) Take by mouth every other day.     Marland Kitchen EVENING PRIMROSE OIL PO Take by mouth. Takes 1300mg  bid    . FLUoxetine (PROZAC) 40 MG capsule   1  . losartan (COZAAR) 50 MG tablet Take 50 mg by mouth daily.    . meloxicam (MOBIC) 7.5 MG tablet Take by mouth.    . Multiple Vitamin (MULTI-VITAMINS) TABS Take by mouth daily.    . SUMAtriptan (IMITREX) 25 MG tablet Take 25 mg by mouth every 2 (two) hours as needed for migraine or headache. May repeat in 2 hours if headache persists or recurs.    . vitamin E 200 UNIT capsule Take 1 capsule by  mouth every other day.      No current facility-administered medications for this visit.     Family History  Problem Relation Age of Onset  . Osteoarthritis Mother   . Asthma Mother   . Hyperlipidemia Mother   . Hypertension Mother   . Migraines Mother   . Stroke Mother   . Skin cancer Mother   . Colon polyps Mother   . Irritable bowel syndrome Mother   . Hyperlipidemia Father   . Hypertension Father   . Diabetes Father 57       pre diabetic  . Diabetes Sister 11       now insukin pump  . Breast cancer Maternal Grandmother   . Heart failure Maternal Grandmother     Review of Systems  Exam:   BP 110/70   Pulse 70   Resp 16   Ht 5' 1.75" (1.568 m)   Wt 150 lb (68 kg)   LMP 04/27/2017 (Exact Date)   BMI 27.66 kg/m     General appearance: alert, cooperative and appears  stated age Head: Normocephalic, without obvious abnormality, atraumatic Neck: no adenopathy, supple, symmetrical, trachea midline and thyroid normal to inspection and palpation Lungs: clear to auscultation bilaterally Breasts: normal appearance, no masses or tenderness, No nipple retraction or dimpling, No nipple discharge or bleeding, No axillary or supraclavicular adenopathy Heart: regular rate and rhythm Abdomen: soft, non-tender; no masses, no organomegaly Extremities: extremities normal, atraumatic, no cyanosis or edema Skin: Skin color, texture, turgor normal. No rashes or lesions Lymph nodes: Cervical, supraclavicular, and axillary nodes normal. No abnormal inguinal nodes palpated Neurologic: Grossly normal  Pelvic: External genitalia:  no lesions              Urethra:  normal appearing urethra with no masses, tenderness or lesions              Bartholins and Skenes: normal                 Vagina: normal appearing vagina with normal color and discharge, no lesions              Cervix:  absent              Pap taken: No. Bimanual Exam:  Uterus:  absent              Adnexa: no mass, fullness, tenderness              Rectal exam: Yes.  .  Confirms.              Anus:  normal sphincter tone, no lesions  Chaperone was present for exam.  Assessment:   Well woman visit with normal exam. Status post robotic hysterectomy with right oophorectomy, bilateral salpingectomy at [facility].  Menopausal symptoms controlled with Prozac and herbal options.  Hx LGSIL.   Plan: Mammogram screening. Recommended self breast awareness. Pap and HR HPV not indicated.  Guidelines for Calcium, Vitamin D, regular exercise program including cardiovascular and weight bearing exercise.   Follow up annually and prn.   After visit summary provided.

## 2018-04-16 NOTE — Patient Instructions (Signed)

## 2018-09-03 ENCOUNTER — Encounter: Payer: Self-pay | Admitting: Obstetrics and Gynecology

## 2018-10-12 ENCOUNTER — Encounter: Payer: Self-pay | Admitting: Obstetrics and Gynecology

## 2018-10-12 ENCOUNTER — Other Ambulatory Visit: Payer: Self-pay | Admitting: Obstetrics and Gynecology

## 2018-10-12 NOTE — Telephone Encounter (Signed)
Patient sent the following correspondence through [place]. Routing to triage to assist patient with request.  The pharmacy has sent a Rx refill request for the fluoxetine, but hasn't gotten a response. Is there anything you need from me to authorize the refill?    Caren Griffins

## 2018-10-13 NOTE — Telephone Encounter (Signed)
Medication refill request: prozac 40mg  Last AEX:  04-16-18 Next AEX: not scheduled Last MMG (if hormonal medication request): 2019 neg Refill authorized: patient sent a mychart message requesting prozac. Chart shows the rx was 40mg . Patient states pharmacy sent [place] a request but we havent seen anything. Please approve if appropriate.

## 2018-10-15 NOTE — Telephone Encounter (Signed)
Spoke with patient & she said that it was another office that prescribed it for her & they refilled it.

## 2021-06-28 ENCOUNTER — Other Ambulatory Visit: Payer: Self-pay | Admitting: Surgery

## 2021-06-28 DIAGNOSIS — N6012 Diffuse cystic mastopathy of left breast: Secondary | ICD-10-CM

## 2021-07-01 ENCOUNTER — Other Ambulatory Visit: Payer: Self-pay | Admitting: Surgery

## 2021-07-01 DIAGNOSIS — N6012 Diffuse cystic mastopathy of left breast: Secondary | ICD-10-CM

## 2021-07-16 NOTE — Progress Notes (Signed)
Surgical Instructions    Your procedure is scheduled on 07/25/21.  Report to [facility] Main Entrance "A" at 07:00 A.M., then check in with the Admitting office.  Call this number if you have problems the morning of surgery:  [phone]   If you have any questions prior to your surgery date call [phone]: Open Monday-Friday 8am-4pm    Remember:  Do not eat after midnight the night before your surgery  You may drink clear liquids until 06:00AM the morning of your surgery.   Clear liquids allowed are: Water, Non-Citrus Juices (without pulp), Carbonated Beverages, Clear Tea, Black Coffee ONLY (NO MILK, CREAM OR POWDERED CREAMER of any kind), and Gatorade  Please complete your PRE-SURGERY ENSURE that was provided to you by 06:00 the morning of surgery.  Please, if able, drink it in one setting. DO NOT SIP.     Take these medicines the morning of surgery with A SIP OF WATER  buPROPion (WELLBUTRIN XL) FLUoxetine (PROZAC) metoprolol succinate (TOPROL-XL)  acetaminophen (TYLENOL) if needed  Please follow your surgeon's instructions for aspirin 81 MG. If you have not received instructions, please contact your surgeon's office for blood thinner instructions.   As of today, STOP taking any Aspirin (unless otherwise instructed by your surgeon) Aleve, Naproxen, Ibuprofen, Motrin, Advil, Goody's, BC's, all herbal medications, fish oil, and all vitamins.          Do not wear jewelry or makeup Do not wear lotions, powders, perfumes/colognes, or deodorant. Do not shave 48 hours prior to surgery.  Men may shave face and neck. Do not bring valuables to the hospital. DO Not wear nail polish, gel polish, artificial nails, or any other type of covering on  natural nails including finger and toenails. If patients have artificial nails, gel coating, etc. that need to be removed by a nail salon please have this removed prior to surgery or surgery may need to be canceled/delayed if the surgeon/  anesthesia feels like the patient is unable to be adequately monitored.             [facility] is not responsible for any belongings or valuables.  Do NOT Smoke (Tobacco/Vaping)  24 hours prior to your procedure If you use a CPAP at night, you may bring all equipment for your overnight stay.   Contacts, glasses, dentures or bridgework may not be worn into surgery, please bring cases for these belongings   For patients admitted to the hospital, discharge time will be determined by your treatment team.   Patients discharged the day of surgery will not be allowed to drive home, and someone needs to stay with them for 24 hours.  ONLY 1 SUPPORT PERSON MAY BE PRESENT WHILE YOU ARE IN SURGERY. IF YOU ARE TO BE ADMITTED ONCE YOU ARE IN YOUR ROOM YOU WILL BE ALLOWED TWO (2) VISITORS.  Minor children may have two parents present. Special consideration for safety and communication needs will be reviewed on a case by case basis.  Special instructions:    Oral Hygiene is also important to reduce your risk of infection.  Remember - BRUSH YOUR TEETH THE MORNING OF SURGERY WITH YOUR REGULAR TOOTHPASTE   [facility]- Preparing For Surgery  Before surgery, you can play an important role. Because skin is not sterile, your skin needs to be as free of germs as possible. You can reduce the number of germs on your skin by washing with CHG (chlorahexidine gluconate) Soap before surgery.  CHG is an antiseptic cleaner  which kills germs and bonds with the skin to continue killing germs even after washing.     Please do not use if you have an allergy to CHG or antibacterial soaps. If your skin becomes reddened/irritated stop using the CHG.  Do not shave (including legs and underarms) for at least 48 hours prior to first CHG shower. It is OK to shave your face.  Please follow these instructions carefully.     Shower the NIGHT BEFORE SURGERY and the MORNING OF SURGERY with CHG Soap.   If you chose to wash your  hair, wash your hair first as usual with your normal shampoo. After you shampoo, rinse your hair and body thoroughly to remove the shampoo.  Then [employer] and genitals (private parts) with your normal soap and rinse thoroughly to remove soap.  After that Use CHG Soap as you would any other liquid soap. You can apply CHG directly to the skin and wash gently with a scrungie or a clean washcloth.   Apply the CHG Soap to your body ONLY FROM THE NECK DOWN.  Do not use on open wounds or open sores. Avoid contact with your eyes, ears, mouth and genitals (private parts). Wash Face and genitals (private parts)  with your normal soap.   Wash thoroughly, paying special attention to the area where your surgery will be performed.  Thoroughly rinse your body with warm water from the neck down.  DO NOT shower/wash with your normal soap after using and rinsing off the CHG Soap.  Pat yourself dry with a CLEAN TOWEL.  Wear CLEAN PAJAMAS to bed the night before surgery  Place CLEAN SHEETS on your bed the night before your surgery  DO NOT SLEEP WITH PETS.   Day of Surgery:  Take a shower with CHG soap. Wear Clean/Comfortable clothing the morning of surgery Do not apply any deodorants/lotions.   Remember to brush your teeth WITH YOUR REGULAR TOOTHPASTE.   Please read over the following fact sheets that you were given.

## 2021-07-17 ENCOUNTER — Encounter (HOSPITAL_COMMUNITY): Payer: Self-pay

## 2021-07-17 ENCOUNTER — Other Ambulatory Visit: Payer: Self-pay

## 2021-07-17 ENCOUNTER — Encounter (HOSPITAL_COMMUNITY)
Admission: RE | Admit: 2021-07-17 | Discharge: 2021-07-17 | Disposition: A | Discharge status: Home / Self Care | Payer: No Typology Code available for payment source | Source: Ambulatory Visit | Attending: Surgery | Admitting: Surgery

## 2021-07-17 DIAGNOSIS — Z01812 Encounter for preprocedural laboratory examination: Secondary | ICD-10-CM | POA: Insufficient documentation

## 2021-07-17 HISTORY — DX: Other complications of anesthesia, initial encounter: T88.59XA

## 2021-07-17 HISTORY — DX: Post covid-19 condition, unspecified: U09.9

## 2021-07-17 LAB — BASIC METABOLIC PANEL
Anion gap: 10 (ref 5–15)
BUN: 11 mg/dL (ref 6–20)
CO2: 22 mmol/L (ref 22–32)
Calcium: 8.9 mg/dL (ref 8.9–10.3)
Chloride: 99 mmol/L (ref 98–111)
Creatinine, Ser: 0.73 mg/dL (ref 0.44–1.00)
GFR, Estimated: >60 mL/min (ref >60)
Glucose, Bld: 93 mg/dL (ref 70–99)
Potassium: 4.4 mmol/L (ref 3.5–5.1)
Sodium: 131 mmol/L — ABNORMAL LOW (ref 135–145)

## 2021-07-17 LAB — CBC
HCT: 36.8 % (ref 36.0–46.0)
Hemoglobin: 11.8 g/dL — ABNORMAL LOW (ref 12.0–15.0)
MCH: 28.8 pg (ref 26.0–34.0)
MCHC: 32.1 g/dL (ref 30.0–36.0)
MCV: 89.8 fL (ref 80.0–100.0)
Platelets: 378 10*3/uL (ref 150–400)
RBC: 4.1 MIL/uL (ref 3.87–5.11)
RDW: 12 % (ref 11.5–15.5)
WBC: 6.6 10*3/uL (ref 4.0–10.5)
nRBC: 0 % (ref 0.0–0.2)

## 2021-07-17 NOTE — Progress Notes (Signed)
PCP - Do R. Everly  Cardiologist - Dr. Murvin Natal  EP- Denies  Endocrine-Denies  Pulm-Denies  Chest x-ray - Denies  EKG - 04/23/21 ([facility])- Req'd  Stress Test - 02/06/21 ([facility])  ECHO - 03/08/21 ([facility])  Cardiac Cath - Denies  AICD-na PM-na LOOP-na  Dialysis-Denies  Sleep Study - Yes- Positive CPAP - Denies, Repeat study to be done after surgery, and may get one, then  LABS- 07/17/21: CBC, BMP  ASA-LD- 9/6  ERAS- yes- no drink  HA1C-Denies  Anesthesia- Yes- req'd records  Pt denies having chest pain, sob, or fever at this time. All instructions explained to the pt, with a verbal understanding of the material. Pt agrees to go over the instructions while at home for a better understanding. Pt also instructed to self quarantine after being tested for COVID-19. The opportunity to ask questions was provided.    Coronavirus Screening  Have you experienced the following symptoms:  Cough yes/no: No Fever (>100.63F)  yes/no: No Runny nose yes/no: No Sore throat yes/no: No Difficulty breathing/shortness of breath  yes/no: No  Have you or a family member traveled in the last 14 days and where? yes/no: No   If the patient indicates "YES" to the above questions, their PAT will be rescheduled to limit the exposure to others and, the surgeon will be notified. THE PATIENT WILL NEED TO BE ASYMPTOMATIC FOR 14 DAYS.   If the patient is not experiencing any of these symptoms, the PAT nurse will instruct them to NOT bring anyone with them to their appointment since they may have these symptoms or traveled as well.   Please remind your patients and families that hospital visitation restrictions are in effect and the importance of the restrictions.

## 2021-07-18 NOTE — Progress Notes (Signed)
Anesthesia Chart Review:  Case: [id number] Date/Time: 07/25/21 0845   Procedure: LEFT BREAST LUMPECTOMY WITH RADIOACTIVE SEED LOCALIZATION (Left: Breast)   Anesthesia type: General   Pre-op diagnosis: ATYPICAL CELLS LEFT BREAST   Location: [facility] OR ROOM 02 / [facility]   Surgeons: Coralie Keens, MD       DISCUSSION: Patient is a 50 year old female scheduled for the above procedure. 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 Reported hard to wake up after anesthesia.   She has had multiple reassuring cardiac tests this past year though [facility] that include 11/2020 Ziopatch with asymptomatic brief runs of atrial tachycardia, non-ischemic stress test 01/2021, normal echo 02/2021, and no significant CAD on 05/2021 CCTA.  She recently had OSA re-evaluation with plans for repeat sleep study after recovery from this surgery. Per notes AHI in 2018 was 11.8. She does not currently use CPAP.  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 Follow-up planned in 2023.  RSL scheduled for 07/23/21 at 2:00 PM. Anesthesia team to evaluate on the day of surgery.    VS: BP 123/76   Pulse 65   Temp 36.8 C (Oral)   Resp 18   Ht 5' 1.5" (1.562 m)   Wt 67.5 kg   LMP 04/27/2017 (Exact Date)   SpO2 100%   BMI 27.68 kg/m    PROVIDERS: Multiple provider through [facility]. See Care Everywhere. Nicola Girt, DO is PCP - Abran Richard, MD is most recent cardiologist visit on 05/23/21. Previously saw Adrian Prows, MD  on 04/17/21.  Francena Hanly, MD is pulmonologist. 06/17/21  evaluation by Worthy Keeler, PA-C for OSA.  He noted, "Her home sleep study 09/06/2017 had an AHI of 11.8. Auto titration CPAP was ordered for her but she was never started on the CPAP machine."  - Vicente Masson, MD is vascular surgeon. 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  Changes were felt subtle, and given she is asymptomatic 2-monthfollow-up planned.   LABS: Labs reviewed: Acceptable for surgery. (all labs ordered are listed, but only abnormal results are displayed)  Labs Reviewed  BASIC METABOLIC PANEL - Abnormal; Notable for the following components:      Result Value   Sodium 131 (*)    All other components within normal limits  CBC - Abnormal; Notable for the following components:   Hemoglobin 11.8 (*)    All other components within normal limits    IMAGES: CT Chest 04/23/21 ([facility]): IMPRESSION:  No acute intrathoracic pathology.    CXR 04/23/21 ([facility]): FINDINGS:  The heart size and mediastinal contours are within normal limits.  Both lungs are clear. The visualized skeletal structures are  unremarkable.  IMPRESSION:  No active cardiopulmonary disease.    MRI Brain 03/07/21 ([facility]): IMPRESSION:  No intracranial mass, hemorrhage, or other significant abnormality.     CT head/CTA head/neck 02/28/21 ([facility]): IMPRESSION:  CT head:  1. No evidence of acute intracranial abnormality.  2. Redemonstrated sequela of prior suboccipital craniectomy.  CTA neck:  1. Streak artifact from a dense right-sided contrast bolus partially  obscures the  proximal right common carotid artery.  2. The visualized common carotid and internal carotid arteries are  patent within the neck without stenosis.  3. Mild beaded irregularity of the cervical left ICA, a finding  which may be seen in the setting of fibromuscular dysplasia.  4. Vertebral arteries patent within the neck without stenosis.  CTA head:  1.  No intracranial large vessel occlusion or proximal high-grade  arterial stenosis.  2. 1 mm vascular protrusion arising from the supraclinoid left ICA,  which may reflect an aneurysm or infundibulum.    EEG 07/30/16 [facility]): Interpretation:  -No clear and precise evidence was seen for any focal or diffuse abnormality.    -Drowsiness and sleep were  achieved.  -No clear epileptiform activity was noted and therefore no definite evidence was noted for a seizure disorder.      EKG: 05/23/21 (Dr. Otho Perl): Tracing on chart. Sinus rhythm  Rightward axis  Low voltage QRS, consider pulmonary disease or obesity  Nonspecific T wave changes  Confirmed by Gabrielle Dare [phone]) on 05/23/2021 2:48:40 PM    CV: CT Coronary 06/07/21 ([facility]): AORTA:  Ascending Aorta:  31.3 mm  Descending Aorta:  22.9 mm  Pulmonary Artery: 21.2 mm   LM: Plaque: Medium caliber vessel with no significant stenosis   LAD: Proximal and mid segments are medium caliber but distal segment is small caliber. No significant stenosis or calcification noted.   LCX: Medium caliber vessel without significant stenosis or calcification noted.   RCA: Medium caliber vessel without significant stenosis or calcification noted. The RCA is the dominant vessel.   CORONARY ARTERY CALCIUM SCORE:   Left Main Coronary Artery:  0  Left Anterior Descending:  0  Circumflex:   0  Right Coronary Artery:  0  Total Agatston Score:  0  MESA Database percentile:  1st   NON-CARDIAC FINDINGS: No significant extracardiac findings noted in this exam that was limited in respect to field of view.   RECOMMENDATIONS:  1. No significant stenosis or obstructive coronary artery disease identified. Patient's coronary artery calcium score is 0.   Echo 03/08/21 ([facility]): SUMMARY  normal chamber sizes  normal LV systolic and diastolic function  normal valves and doppler study  no effusion  normal IVC size.     Nuclear stress test 02/06/21 ([facility]): IMPRESSION:  1.  Normal nuclear stress test without evidence of fixed or inducible  ischemia.  2.  Calculated ejection fraction is 70%  3.  Normal nuclear stress test with normal ejection fraction     Cardiac Ziopatch Monitor 11/16/20 ([facility]): Findings:  Min HR of 58 bpm, max HR of 197 bpm, and avg HR of 81 bpm.  Predominant underlying rhythm was sinus rhythm.  5 supraventricular tachycardia runs occurred, the run with the fastest interval lasting 8 beats with a max rate of 197 bpm, the longest lasting 7 beats with an average rate of 105 bpm.  Isolated SVEs were rare (< 1.0%), SVE couplets were rare (< 1.0%), and SVE triplets were rare (< 1.0%). Isolated VEs were rare (< 1.0%), and no VE couplets or VE triplets were present.  - Per 01/16/21 cardiology consult with Adrian Prows, MD with [facility], "She did wear a Zio Patch recently and had a few episodes of atrial tachycardia lasting 4-8 beats in duration but none were symptomatic. Her symptomatic events centered around episodes of chest and neck tightness with shortness of breath and were related to sinus rhythm or sinus tachycardia." He  did not recommend treatment for asymptomatic short runs of atrial tachycardia.   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   COVID-19 long hauler    Had Covid-19 in 09/2020   Depression    Fibroid 2000   Gallstones    GERD (gastroesophageal reflux disease)    HLD (hyperlipidemia)    Hypertension    Migraine, unspecified migraine type    --complex   Mild dysplasia of cervix (CIN I) 2015, 2016   colposcopic biopsies   Pneumonia    Sleep apnea     Trichomonas infection 06/2017   Vitamin D deficiency 06/11/2015    Past Surgical History:  Procedure Laterality Date   ABDOMINAL HYSTERECTOMY     & oopherectomy   APPENDECTOMY  1977   BREAST LUMPECTOMY Left 2000   lumpectomy   CHOLECYSTECTOMY  10/2015   --[facility]   CRANIECTOMY  2002   DILATION AND CURETTAGE OF UTERUS     GYNECOLOGIC CRYOSURGERY  1996   TONSILLECTOMY     UTERINE ARTERY EMBOLIZATION  2011    MEDICATIONS:  acetaminophen (TYLENOL) 325 MG tablet   acidophilus (RISAQUAD) CAPS capsule   aspirin 81 MG EC tablet   baclofen (LIORESAL) 10 MG tablet   buPROPion (WELLBUTRIN XL) 150 MG 24 hr tablet   FLUoxetine (PROZAC) 20 MG capsule   losartan (COZAAR) 50 MG tablet   metoprolol succinate (TOPROL-XL) 25 MG 24 hr tablet   Multiple Vitamin (MULTI-VITAMINS) TABS   sodium chloride (OCEAN) 0.65 % SOLN nasal spray   SUMAtriptan (IMITREX) 25 MG tablet   No current facility-administered medications for this encounter.    Myra Gianotti, PA-C Surgical Short Stay/Anesthesiology [facility] Phone [phone] [facility] Phone [phone] 07/19/2021 11:49 AM

## 2021-07-19 NOTE — Anesthesia Preprocedure Evaluation (Addendum)
Anesthesia Evaluation  Patient identified by MRN, date of birth, ID band Patient awake    Reviewed: Allergy & Precautions, NPO status , Patient's Chart, lab work & pertinent test results  History of Anesthesia Complications (+) PROLONGED EMERGENCE and history of anesthetic complications  Airway Mallampati: I       Dental no notable dental hx.    Pulmonary sleep apnea ,    Pulmonary exam normal        Cardiovascular hypertension, Pt. on home beta blockers and Pt. on medications Normal cardiovascular exam     Neuro/Psych  Headaches, PSYCHIATRIC DISORDERS Depression    GI/Hepatic   Endo/Other    Renal/GU      Musculoskeletal   Abdominal Normal abdominal exam  (+)   Peds  Hematology   Anesthesia Other Findings   Reproductive/Obstetrics                            Anesthesia Physical Anesthesia Plan  ASA: 2  Anesthesia Plan: General   Post-op Pain Management:    Induction: Intravenous  PONV Risk Score and Plan: 3 and Ondansetron, Dexamethasone and Midazolam  Airway Management Planned: LMA  Additional Equipment: None  Intra-op Plan:   Post-operative Plan: Extubation in OR  Informed Consent: I have reviewed the patients History and Physical, chart, labs and discussed the procedure including the risks, benefits and alternatives for the proposed anesthesia with the patient or authorized representative who has indicated his/her understanding and acceptance.     Dental advisory given  Plan Discussed with: CRNA  Anesthesia Plan Comments: (PAT note written 07/19/2021 by Myra Gianotti, PA-C. )       Anesthesia Quick Evaluation

## 2021-07-23 ENCOUNTER — Other Ambulatory Visit: Payer: Self-pay

## 2021-07-23 ENCOUNTER — Ambulatory Visit
Admission: RE | Admit: 2021-07-23 | Discharge: 2021-07-23 | Disposition: A | Discharge status: Home / Self Care | Payer: No Typology Code available for payment source | Source: Ambulatory Visit | Attending: Surgery | Admitting: Surgery

## 2021-07-23 DIAGNOSIS — N6012 Diffuse cystic mastopathy of left breast: Secondary | ICD-10-CM

## 2021-07-24 NOTE — H&P (Addendum)
PROVIDER: Beverlee Nims, MD  MRN: [id number] DOB: 07-25-1971 DATE OF ENCOUNTER: 06/28/2021  Subjective   Chief Complaint: Ductal Hyperplasia    History of Present Illness: Karen Avila is a 50 y.o. female who is seen today as an office consultation at the request of Dr. Suzy Bouchard for evaluation of Ductal Hyperplasia  .   This is a 50 year old female referred here for left breast atypia found on recent biopsy. She had undergone a recent screening mammography when a 5 mm area of suspicion was evaluated in the left breast. She underwent a biopsy of this which showed hyperplasia with some atypia. She has a strong family history of breast cancer so surgical excision of this area was recommended by her physicians in [facility]. She has had a previous biopsy in 2004 for the left breast for a papilloma. She denies nipple discharge. She is otherwise currently without complaints.  Review of Systems: A complete review of systems was obtained from the patient. I have reviewed this information and discussed as appropriate with the patient. See HPI as well for other ROS.  ROS   Medical History: Past Medical History:  Diagnosis Date   Anemia   Hyperlipidemia   Hypertension   Sleep apnea   Patient Active Problem List  Diagnosis   Achilles tendonitis, bilateral   Acute pharyngitis, unspecified   Anesthesia complication   Arnold-Chiari malformation (CMS-HCC)   Benign essential HTN   Carpal tunnel syndrome on both sides   Cervical spinal stenosis   Chest pain syndrome   Chondromalacia of both patellae   Chronic daily headache   Chronic fatigue   Chronic maxillary sinusitis   Chronic right shoulder pain   DOE (dyspnea on exertion)   Dyslipidemia   Family history of colonic polyps   Fibromuscular dysplasia of carotid artery (CMS-HCC)   Frequent headaches   Heartburn   History of hysterectomy with unilateral oophorectomy   Hyponatremia   Impingement syndrome of right shoulder    Insomnia   Iron deficiency   Irregular bowel habits   Memory loss   Moderate episode of recurrent major depressive disorder (CMS-HCC)   Multiple joint pain   Stroke-like symptom   Nasal congestion   Nasal polyps   Nasal turbinate hypertrophy   Numbness and tingling of both feet   OSA (obstructive sleep apnea)   Palpitations   Seborrheic keratoses   Snoring   Vasovagal syncope   Vitamin D deficiency   Past Surgical History:  Procedure Laterality Date   APPENDECTOMY   Breast Surgery Left   CHOLECYSTECTOMY   Craniectomy Surgery 2001   HERNIA REPAIR    Allergies  Allergen Reactions   Tramadol Anaphylaxis  swelli ng, redness, itching, SOB swelli ng, redness, itching, SOB   Current Outpatient Medications on File Prior to Visit  Medication Sig Dispense Refill   aspirin 81 MG EC tablet Take by mouth once daily   baclofen (LIORESAL) 10 MG tablet   buPROPion (WELLBUTRIN XL) 150 MG XL tablet   FLUoxetine (PROZAC) 20 MG capsule   losartan (COZAAR) 50 MG tablet   metoprolol succinate (TOPROL-XL) 25 MG XL tablet   multivitamin tablet Take by mouth   SUMAtriptan (IMITREX) 25 MG tablet Take by mouth   No current facility-administered medications on file prior to visit.   History reviewed. No pertinent family history.   Social History   Tobacco Use  Smoking Status Never Smoker  Smokeless Tobacco Never Used    Social History   Socioeconomic History  Marital status: Divorced  Tobacco Use   Smoking status: Never Smoker   Smokeless tobacco: Never Used  Vaping Use   Vaping Use: Never used  Substance and Sexual Activity   Alcohol use: Yes   Drug use: Never   Objective:   Vitals:  06/28/21 1105  BP: 110/72  Pulse: 69  Temp: 36.8 C (98.2 F)  SpO2: 100%  Weight: 67.1 kg (148 lb)  Height: 156.2 cm (5' 1.5")   Body mass index is 27.51 kg/m.  Physical Exam   She appears well on exam.  There are no palpable breast masses. There is ecchymosis in the left  breast from the area of biopsy. There is no axillary adenopathy. The nipple areolar complexes appear normal.  Lungs Clear  CV RRR  Abdomen soft, NT  Labs, Imaging and Diagnostic Testing: I have reviewed her mammograms and ultrasounds. I have reviewed her pathology results from [facility].  Assessment and Plan:  Diagnoses and all orders for this visit:  Atypical ductal hyperplasia of left breast    I have reviewed her notes in the electronic medical records extensively. Again she has a left breast abnormality which shows some florid hyperplasia and possible atypia. Because of her family history of breast cancer and pathology findings, a radioactive seed guided left breast lumpectomy is strongly recommended. She also wants to proceed with surgery given her history. I discussed the surgical procedure with her in detail. We discussed the risk which includes but is not limited to bleeding, infection, injury to surrounding structures, the need for further surgery malignancy is found, cardiopulmonary issues, postoperative recovery, etc. Surgery will be scheduled.

## 2021-07-25 ENCOUNTER — Ambulatory Visit
Admission: RE | Admit: 2021-07-25 | Discharge: 2021-07-25 | Disposition: A | Discharge status: Home / Self Care | Payer: PRIVATE HEALTH INSURANCE | Source: Ambulatory Visit | Attending: Surgery | Admitting: Surgery

## 2021-07-25 ENCOUNTER — Ambulatory Visit (HOSPITAL_COMMUNITY): Payer: PRIVATE HEALTH INSURANCE | Admitting: Emergency Medicine

## 2021-07-25 ENCOUNTER — Encounter (HOSPITAL_COMMUNITY)
Admission: RE | Disposition: A | Discharge status: Home / Self Care | Payer: Self-pay | Source: Home / Self Care | Attending: Surgery

## 2021-07-25 ENCOUNTER — Ambulatory Visit (HOSPITAL_COMMUNITY)
Admission: RE | Admit: 2021-07-25 | Discharge: 2021-07-25 | Disposition: A | Discharge status: Home / Self Care | Payer: PRIVATE HEALTH INSURANCE | Attending: Surgery | Admitting: Surgery

## 2021-07-25 ENCOUNTER — Ambulatory Visit (HOSPITAL_COMMUNITY): Payer: PRIVATE HEALTH INSURANCE | Admitting: Certified Registered Nurse Anesthetist

## 2021-07-25 ENCOUNTER — Encounter (HOSPITAL_COMMUNITY): Payer: Self-pay | Admitting: Surgery

## 2021-07-25 DIAGNOSIS — I10 Essential (primary) hypertension: Secondary | ICD-10-CM | POA: Diagnosis not present

## 2021-07-25 DIAGNOSIS — G4733 Obstructive sleep apnea (adult) (pediatric): Secondary | ICD-10-CM | POA: Diagnosis not present

## 2021-07-25 DIAGNOSIS — E785 Hyperlipidemia, unspecified: Secondary | ICD-10-CM | POA: Diagnosis not present

## 2021-07-25 DIAGNOSIS — N6092 Unspecified benign mammary dysplasia of left breast: Secondary | ICD-10-CM | POA: Diagnosis not present

## 2021-07-25 DIAGNOSIS — Z885 Allergy status to narcotic agent status: Secondary | ICD-10-CM | POA: Diagnosis not present

## 2021-07-25 DIAGNOSIS — Z90710 Acquired absence of both cervix and uterus: Secondary | ICD-10-CM | POA: Diagnosis not present

## 2021-07-25 DIAGNOSIS — Z90721 Acquired absence of ovaries, unilateral: Secondary | ICD-10-CM | POA: Insufficient documentation

## 2021-07-25 DIAGNOSIS — R413 Other amnesia: Secondary | ICD-10-CM | POA: Insufficient documentation

## 2021-07-25 DIAGNOSIS — N6489 Other specified disorders of breast: Secondary | ICD-10-CM | POA: Diagnosis not present

## 2021-07-25 DIAGNOSIS — N6012 Diffuse cystic mastopathy of left breast: Secondary | ICD-10-CM

## 2021-07-25 DIAGNOSIS — Z803 Family history of malignant neoplasm of breast: Secondary | ICD-10-CM | POA: Diagnosis not present

## 2021-07-25 DIAGNOSIS — Z7982 Long term (current) use of aspirin: Secondary | ICD-10-CM | POA: Insufficient documentation

## 2021-07-25 DIAGNOSIS — Z9049 Acquired absence of other specified parts of digestive tract: Secondary | ICD-10-CM | POA: Diagnosis not present

## 2021-07-25 HISTORY — PX: BREAST LUMPECTOMY WITH RADIOACTIVE SEED LOCALIZATION: SHX6424

## 2021-07-25 SURGERY — BREAST LUMPECTOMY WITH RADIOACTIVE SEED LOCALIZATION
Anesthesia: General | Site: Breast | Laterality: Left

## 2021-07-25 MED ORDER — LACTATED RINGERS IV SOLN: INTRAVENOUS | Status: DC

## 2021-07-25 MED ORDER — ACETAMINOPHEN 325 MG PO TABS: 325.0000 mg | ORAL_TABLET | ORAL | Status: DC | PRN

## 2021-07-25 MED ORDER — FENTANYL CITRATE (PF) 250 MCG/5ML IJ SOLN
INTRAMUSCULAR | Status: AC
  Filled 2021-07-25: qty 5

## 2021-07-25 MED ORDER — BUPIVACAINE-EPINEPHRINE (PF) 0.25% -1:200000 IJ SOLN
INTRAMUSCULAR | Status: AC
  Filled 2021-07-25: qty 30

## 2021-07-25 MED ORDER — FENTANYL CITRATE (PF) 100 MCG/2ML IJ SOLN: 25.0000 ug | INTRAMUSCULAR | Status: DC | PRN

## 2021-07-25 MED ORDER — ACETAMINOPHEN 160 MG/5ML PO SOLN: 325.0000 mg | ORAL | Status: DC | PRN

## 2021-07-25 MED ORDER — ONDANSETRON HCL 4 MG/2ML IJ SOLN
INTRAMUSCULAR | Status: DC | PRN
  Administered 2021-07-25: 4 mg via INTRAVENOUS

## 2021-07-25 MED ORDER — CHLORHEXIDINE GLUCONATE CLOTH 2 % EX PADS: 6.0000 | MEDICATED_PAD | Freq: Once | CUTANEOUS | Status: DC

## 2021-07-25 MED ORDER — ACETAMINOPHEN 500 MG PO TABS
1000.0000 mg | ORAL_TABLET | ORAL | Status: AC
  Administered 2021-07-25: 1000 mg via ORAL
  Filled 2021-07-25: qty 2

## 2021-07-25 MED ORDER — FENTANYL CITRATE (PF) 250 MCG/5ML IJ SOLN
INTRAMUSCULAR | Status: DC | PRN
  Administered 2021-07-25: 25 ug via INTRAVENOUS

## 2021-07-25 MED ORDER — 0.9 % SODIUM CHLORIDE (POUR BTL) OPTIME
TOPICAL | Status: DC | PRN
  Administered 2021-07-25: 1000 mL

## 2021-07-25 MED ORDER — KETOROLAC TROMETHAMINE 30 MG/ML IJ SOLN
30.0000 mg | Freq: Once | INTRAMUSCULAR | Status: DC | PRN
Start: 2021-07-25 — End: 2021-07-25

## 2021-07-25 MED ORDER — LIDOCAINE 2% (20 MG/ML) 5 ML SYRINGE
INTRAMUSCULAR | Status: AC
  Filled 2021-07-25: qty 5

## 2021-07-25 MED ORDER — CHLORHEXIDINE GLUCONATE 0.12 % MT SOLN
15.0000 mL | Freq: Once | OROMUCOSAL | Status: AC
  Administered 2021-07-25: 15 mL via OROMUCOSAL
  Filled 2021-07-25: qty 15

## 2021-07-25 MED ORDER — CEFAZOLIN SODIUM-DEXTROSE 2-4 GM/100ML-% IV SOLN
2.0000 g | INTRAVENOUS | Status: AC
  Administered 2021-07-25: 2 g via INTRAVENOUS
  Filled 2021-07-25: qty 100

## 2021-07-25 MED ORDER — PROPOFOL 10 MG/ML IV BOLUS
INTRAVENOUS | Status: DC | PRN
  Administered 2021-07-25: 50 mg via INTRAVENOUS
  Administered 2021-07-25: 150 mg via INTRAVENOUS

## 2021-07-25 MED ORDER — ONDANSETRON HCL 4 MG/2ML IJ SOLN: 4.0000 mg | Freq: Once | INTRAMUSCULAR | Status: DC | PRN

## 2021-07-25 MED ORDER — ORAL CARE MOUTH RINSE: 15.0000 mL | Freq: Once | OROMUCOSAL | Status: AC

## 2021-07-25 MED ORDER — EPHEDRINE 5 MG/ML INJ
INTRAVENOUS | Status: AC
  Filled 2021-07-25: qty 5

## 2021-07-25 MED ORDER — DEXAMETHASONE SODIUM PHOSPHATE 10 MG/ML IJ SOLN
INTRAMUSCULAR | Status: DC | PRN
  Administered 2021-07-25: 5 mg via INTRAVENOUS

## 2021-07-25 MED ORDER — MIDAZOLAM HCL 2 MG/2ML IJ SOLN
INTRAMUSCULAR | Status: AC
  Filled 2021-07-25: qty 2

## 2021-07-25 MED ORDER — EPHEDRINE SULFATE-NACL 50-0.9 MG/10ML-% IV SOSY
PREFILLED_SYRINGE | INTRAVENOUS | Status: DC | PRN
  Administered 2021-07-25 (×2): 5 mg via INTRAVENOUS

## 2021-07-25 MED ORDER — LIDOCAINE 2% (20 MG/ML) 5 ML SYRINGE
INTRAMUSCULAR | Status: DC | PRN
  Administered 2021-07-25: 100 mg via INTRAVENOUS

## 2021-07-25 MED ORDER — PROPOFOL 10 MG/ML IV BOLUS
INTRAVENOUS | Status: AC
  Filled 2021-07-25: qty 20

## 2021-07-25 MED ORDER — BUPIVACAINE-EPINEPHRINE 0.25% -1:200000 IJ SOLN
INTRAMUSCULAR | Status: DC | PRN
  Administered 2021-07-25: 11 mL

## 2021-07-25 SURGICAL SUPPLY — 38 items
ADH SKN CLS APL DERMABOND .7 (GAUZE/BANDAGES/DRESSINGS) ×1
APL PRP STRL LF DISP 70% ISPRP (MISCELLANEOUS) ×1
APPLIER CLIP 9.375 MED OPEN (MISCELLANEOUS) ×2
APR CLP MED 9.3 20 MLT OPN (MISCELLANEOUS) ×1
BAG COUNTER SPONGE SURGICOUNT (BAG) ×2 IMPLANT
BAG SPNG CNTER NS LX DISP (BAG) ×1
BINDER BREAST LRG (GAUZE/BANDAGES/DRESSINGS) IMPLANT
BINDER BREAST XLRG (GAUZE/BANDAGES/DRESSINGS) ×1 IMPLANT
CANISTER SUCT 3000ML PPV (MISCELLANEOUS) ×2 IMPLANT
CHLORAPREP W/TINT 26 (MISCELLANEOUS) ×2 IMPLANT
CLIP APPLIE 9.375 MED OPEN (MISCELLANEOUS) ×1 IMPLANT
COVER PROBE W GEL 5X96 (DRAPES) ×2 IMPLANT
COVER SURGICAL LIGHT HANDLE (MISCELLANEOUS) ×2 IMPLANT
DERMABOND ADVANCED (GAUZE/BANDAGES/DRESSINGS) ×1
DERMABOND ADVANCED .7 DNX12 (GAUZE/BANDAGES/DRESSINGS) ×1 IMPLANT
DEVICE DUBIN SPECIMEN MAMMOGRA (MISCELLANEOUS) ×2 IMPLANT
DRAPE CHEST BREAST 15X10 FENES (DRAPES) ×2 IMPLANT
ELECT CAUTERY BLADE 6.4 (BLADE) ×2 IMPLANT
ELECT REM PT RETURN 9FT ADLT (ELECTROSURGICAL) ×2
ELECTRODE REM PT RTRN 9FT ADLT (ELECTROSURGICAL) ×1 IMPLANT
GAUZE SPONGE 4X4 12PLY STRL (GAUZE/BANDAGES/DRESSINGS) ×1 IMPLANT
GLOVE SURG SIGNA 7.5 PF LTX (GLOVE) ×2 IMPLANT
GOWN STRL REUS W/ TWL LRG LVL3 (GOWN DISPOSABLE) ×1 IMPLANT
GOWN STRL REUS W/ TWL XL LVL3 (GOWN DISPOSABLE) ×1 IMPLANT
GOWN STRL REUS W/TWL LRG LVL3 (GOWN DISPOSABLE) ×2
GOWN STRL REUS W/TWL XL LVL3 (GOWN DISPOSABLE) ×2
KIT BASIN OR (CUSTOM PROCEDURE TRAY) ×2 IMPLANT
KIT MARKER MARGIN INK (KITS) ×2 IMPLANT
NDL HYPO 25GX1X1/2 BEV (NEEDLE) ×1 IMPLANT
NEEDLE HYPO 25GX1X1/2 BEV (NEEDLE) ×2 IMPLANT
NS IRRIG 1000ML POUR BTL (IV SOLUTION) IMPLANT
PACK GENERAL/GYN (CUSTOM PROCEDURE TRAY) ×2 IMPLANT
PAD ABD 8X10 STRL (GAUZE/BANDAGES/DRESSINGS) ×1 IMPLANT
SUT MNCRL AB 4-0 PS2 18 (SUTURE) ×2 IMPLANT
SUT VIC AB 3-0 SH 18 (SUTURE) ×2 IMPLANT
SYR CONTROL 10ML LL (SYRINGE) ×2 IMPLANT
TOWEL GREEN STERILE (TOWEL DISPOSABLE) ×2 IMPLANT
TOWEL GREEN STERILE FF (TOWEL DISPOSABLE) ×2 IMPLANT

## 2021-07-25 NOTE — Discharge Instructions (Signed)
[facility] Office Phone Number [phone]  BREAST BIOPSY/ PARTIAL MASTECTOMY: POST OP INSTRUCTIONS  Always review your discharge instruction sheet given to you by the facility where your surgery was performed.  IF YOU HAVE DISABILITY OR FAMILY LEAVE FORMS, YOU MUST BRING THEM TO THE OFFICE FOR PROCESSING.  DO NOT GIVE THEM TO YOUR DOCTOR.  A prescription for pain medication may be given to you upon discharge.  Take your pain medication as prescribed, if needed.  If narcotic pain medicine is not needed, then you may take acetaminophen (Tylenol) or ibuprofen (Advil) as needed. Take your usually prescribed medications unless otherwise directed If you need a refill on your pain medication, please contact your pharmacy.  They will contact our office to request authorization.  Prescriptions will not be filled after 5pm or on week-ends. You should eat very light the first 24 hours after surgery, such as soup, crackers, pudding, etc.  Resume your normal diet the day after surgery. Most patients will experience some swelling and bruising in the breast.  Ice packs and a good support bra will help.  Swelling and bruising can take several days to resolve.  It is common to experience some constipation if taking pain medication after surgery.  Increasing fluid intake and taking a stool softener will usually help or prevent this problem from occurring.  A mild laxative (Milk of Magnesia or Miralax) should be taken according to package directions if there are no bowel movements after 48 hours. Unless discharge instructions indicate otherwise, you may remove your bandages 24-48 hours after surgery, and you may shower at that time.  You may have steri-strips (small skin tapes) in place directly over the incision.  These strips should be left on the skin for 7-10 days.  If your surgeon used skin glue on the incision, you may shower in 24 hours.  The glue will flake off over the next 2-3 weeks.  Any  sutures or staples will be removed at the office during your follow-up visit. ACTIVITIES:  You may resume regular daily activities (gradually increasing) beginning the next day.  Wearing a good support bra or sports bra minimizes pain and swelling.  You may have sexual intercourse when it is comfortable. You may drive when you no longer are taking prescription pain medication, you can comfortably wear a seatbelt, and you can safely maneuver your car and apply brakes. RETURN TO WORK:  ______________________________________________________________________________________ You should see your doctor in the office for a follow-up appointment approximately two weeks after your surgery.  Your doctor's nurse will typically make your follow-up appointment when she calls you with your pathology report.  Expect your pathology report 2-3 business days after your surgery.  You may call to check if you do not hear from us after three days. OTHER INSTRUCTIONS: OK TO SHOWER STARTING TOMORROW ICE PACK, TYLENOL, AND IBUPROFEN ALSO FOR PAIN NO VIGOROUS ACTIVITY FOR ONE WEEK _______________________________________________________________________________________________ _____________________________________________________________________________________________________________________________________ _____________________________________________________________________________________________________________________________________ _____________________________________________________________________________________________________________________________________  WHEN TO CALL YOUR DOCTOR: Fever over 101.0 Nausea and/or vomiting. Extreme swelling or bruising. Continued bleeding from incision. Increased pain, redness, or drainage from the incision.  The clinic staff is available to answer your questions during regular business hours.  Please don't hesitate to call and ask to speak to one of the nurses for clinical  concerns.  If you have a medical emergency, go to the nearest emergency room or call 911.  A surgeon from [facility] is always on call at the hospital.  For further questions, please visit   [email or web address]   

## 2021-07-25 NOTE — Op Note (Signed)
LEFT BREAST LUMPECTOMY WITH RADIOACTIVE SEED LOCALIZATION  Procedure Note  Karen Avila 07/25/2021   Pre-op Diagnosis: ATYPICAL CELLS LEFT BREAST     Post-op Diagnosis: same  Procedure(s): LEFT BREAST LUMPECTOMY WITH RADIOACTIVE SEED LOCALIZATION  Surgeon(s): Coralie Keens, MD  Anesthesia: General  Staff:  Circulator: Rosanne Sack, RN; Delman Cheadle, RN; Celene Squibb, RN Relief Scrub: Rolan Bucco Scrub Person: Martin Majestic  Estimated Blood Loss: Minimal               Specimens: sent to path  Indications: This is a 50 year old female with a strong family history of breast cancer who presented with an abnormality in the left breast on screening mammography.  She has had biopsies in the past which were benign.  She had a biopsy of this abnormal area showing atypical cells.  The decision was then made to proceed to the operating room for a radioactive seed guided lumpectomy.  Procedure: The patient brought to operating identifies correct patient.  She is placed upon the operating table and general anesthesia was induced.  Her left breast was prepped and draped in usual sterile fashion.  I located the seed in the lateral lower outer quadrant of the left breast.  This was a far lateral I decide to make incision over the top of the seed which was localized with the neoprobe.  I anesthetized skin with Marcaine and made incision with scalpel.  I then dissected down to the breast tissue with electrocautery.  I then performed a lumpectomy staying around the seed with the aid of the neoprobe.  The lumpectomy specimen was then completely removed with the cautery.  I marked all margins with pain.  An x-ray was performed confirming that the radioactive seed and previous tissue marker were in the specimen.  The specimen was then sent to pathology for evaluation.  I achieved hemostasis with the cautery.  I anesthetized the incision further with Marcaine.  I then closed the  subcutaneous tissue with interrupted 3-0 Vicryl sutures and closed skin with running 4-0 Monocryl.  Dermabond was then applied.  The patient tolerated the procedure well.  All the counts were correct at the end the procedure.  The patient was then extubated in the operating room and taken in stable addition to the recovery room.          Coralie Keens   Date: 07/25/2021  Time: 9:23 AM

## 2021-07-25 NOTE — Anesthesia Postprocedure Evaluation (Signed)
Anesthesia Post Note  Patient: Karen Avila  Procedure(s) Performed: LEFT BREAST LUMPECTOMY WITH RADIOACTIVE SEED LOCALIZATION (Left: Breast)     Patient location during evaluation: Phase II Anesthesia Type: General Level of consciousness: awake Pain management: pain level controlled Vital Signs Assessment: post-procedure vital signs reviewed and stable Respiratory status: spontaneous breathing Cardiovascular status: stable Postop Assessment: no apparent nausea or vomiting Anesthetic complications: no   No notable events documented.  Last Vitals:  Vitals:   07/25/21 0945 07/25/21 1000  BP: 129/77 119/74  Pulse: 60 (!) 57  Resp: 12 15  Temp:  36.5 C  SpO2: 90% 100%    Last Pain:  Vitals:   07/25/21 1000  TempSrc:   PainSc: [place]

## 2021-07-25 NOTE — Anesthesia Procedure Notes (Signed)
Procedure Name: LMA Insertion Date/Time: 07/25/2021 8:56 AM Performed by: Janene Harvey, CRNA Pre-anesthesia Checklist: Patient identified, Emergency Drugs available, Suction available and Patient being monitored Patient Re-evaluated:Patient Re-evaluated prior to induction Oxygen Delivery Method: Circle system utilized Preoxygenation: Pre-oxygenation with 100% oxygen Induction Type: IV induction Ventilation: Mask ventilation without difficulty LMA: LMA inserted LMA Size: 3.0 Placement Confirmation: positive ETCO2 Tube secured with: Tape Dental Injury: Teeth and Oropharynx as per pre-operative assessment

## 2021-07-25 NOTE — Transfer of Care (Signed)
Immediate Anesthesia Transfer of Care Note  Patient: Karen Avila  Procedure(s) Performed: LEFT BREAST LUMPECTOMY WITH RADIOACTIVE SEED LOCALIZATION (Left: Breast)  Patient Location: PACU  Anesthesia Type:General  Level of Consciousness: awake, alert  and patient cooperative  Airway & Oxygen Therapy: Patient Spontanous Breathing and Patient connected to face mask oxygen  Post-op Assessment: Report given to RN and Post -op Vital signs reviewed and stable  Post vital signs: Reviewed  Last Vitals:  Vitals Value Taken Time  BP 123/63 07/25/21 0930  Temp    Pulse 69 07/25/21 0931  Resp 18 07/25/21 0931  SpO2 100 % 07/25/21 0931  Vitals shown include unvalidated device data.  Last Pain:  Vitals:   07/25/21 0715  TempSrc: Oral  PainSc: 1       Patients Stated Pain Goal: 3 ([id number])  Complications: No notable events documented.

## 2021-07-25 NOTE — Interval H&P Note (Signed)
History and Physical Interval Note: no change in H and P  07/25/2021 8:23 AM  Karen Avila  has presented today for surgery, with the diagnosis of ATYPICAL CELLS LEFT BREAST.  The various methods of treatment have been discussed with the patient and family. After consideration of risks, benefits and other options for treatment, the patient has consented to  Procedure(s): LEFT BREAST LUMPECTOMY WITH RADIOACTIVE SEED LOCALIZATION (Left) as a surgical intervention.  The patient's history has been reviewed, patient examined, no change in status, stable for surgery.  I have reviewed the patient's chart and labs.  Questions were answered to the patient's satisfaction.     Coralie Keens

## 2021-07-26 ENCOUNTER — Encounter (HOSPITAL_COMMUNITY): Payer: Self-pay | Admitting: Surgery

## 2021-08-01 LAB — SURGICAL PATHOLOGY

## 2021-08-16 ENCOUNTER — Telehealth: Payer: Self-pay | Admitting: Hematology and Oncology

## 2021-08-16 NOTE — Telephone Encounter (Signed)
Scheduled appt per 10/7 referral. Pt is aware of appt date and time.

## 2021-08-22 ENCOUNTER — Other Ambulatory Visit: Payer: Self-pay

## 2021-08-22 ENCOUNTER — Encounter: Payer: Self-pay | Admitting: Hematology and Oncology

## 2021-08-22 ENCOUNTER — Inpatient Hospital Stay
Payer: No Typology Code available for payment source | Attending: Hematology and Oncology | Admitting: Hematology and Oncology

## 2021-08-22 VITALS — BP 102/54 | HR 59 | Temp 97.0°F | Resp 18 | Wt 151.4 lb

## 2021-08-22 DIAGNOSIS — Z7982 Long term (current) use of aspirin: Secondary | ICD-10-CM | POA: Insufficient documentation

## 2021-08-22 DIAGNOSIS — K219 Gastro-esophageal reflux disease without esophagitis: Secondary | ICD-10-CM | POA: Insufficient documentation

## 2021-08-22 DIAGNOSIS — Z9189 Other specified personal risk factors, not elsewhere classified: Secondary | ICD-10-CM

## 2021-08-22 DIAGNOSIS — Z803 Family history of malignant neoplasm of breast: Secondary | ICD-10-CM | POA: Insufficient documentation

## 2021-08-22 DIAGNOSIS — Z79899 Other long term (current) drug therapy: Secondary | ICD-10-CM | POA: Diagnosis not present

## 2021-08-22 DIAGNOSIS — G4730 Sleep apnea, unspecified: Secondary | ICD-10-CM | POA: Insufficient documentation

## 2021-08-22 DIAGNOSIS — N6092 Unspecified benign mammary dysplasia of left breast: Secondary | ICD-10-CM | POA: Diagnosis present

## 2021-08-22 DIAGNOSIS — F32A Depression, unspecified: Secondary | ICD-10-CM | POA: Diagnosis not present

## 2021-08-22 DIAGNOSIS — I10 Essential (primary) hypertension: Secondary | ICD-10-CM | POA: Diagnosis not present

## 2021-08-22 DIAGNOSIS — E785 Hyperlipidemia, unspecified: Secondary | ICD-10-CM | POA: Diagnosis not present

## 2021-08-22 MED ORDER — TAMOXIFEN CITRATE 10 MG PO TABS: 5.0000 mg | ORAL_TABLET | Freq: Every day | ORAL | 2 refills | Status: AC

## 2021-08-22 NOTE — Progress Notes (Signed)
[name] CONSULT NOTE  Patient Care Team: Nicola Girt, DO as PCP - General (Internal Medicine)  CHIEF COMPLAINTS/PURPOSE OF CONSULTATION:  ADH  ASSESSMENT & PLAN:  This is a very pleasant 50 year old female patient with recently diagnosed atypical ductal hyperplasia referred to high risk breast clinic for further recommendation.  Pathology review: I discussed the difference between atypical ductal hyperplasia, DCIS and invasive breast cancer. I explained to her that atypical ductal hyperplasia  is characterized by a proliferation of uniform epithelial cells filling part, but not the entirety, of the involved duct. ADH is associated with an increased risk of both ipsilateral and contralateral breast cancer and thus provides evidence of underlying breast abnormalities that predispose to breast cancer.  Given strong family history of breast cancer, recent diagnosis of atypical ductal hyperplasia, we have discussed about endocrine therapy to reduce her risk of breast cancer.  Since she is not clearly most postmenopausal, we have focused our discussion on tamoxifen.  I discussed the risks and benefits of tamoxifen therapy. I believe that the risks far outweigh any benefits from tamoxifen. Instead I recommended the following 1. Exercise 30 minutes daily or 150 minutes a week. 2. Weight loss 3. Increasing fruits and vegetables and less red meat I believe all of the above would extensively decrease her risk of breast cancer as well as other cancers including cancers of the colon substantially.  She is very worried about trying tamoxifen 20 mg and would like to try the low-dose tamoxifen.  We have discussed that low-dose tamoxifen has been studied in TAM 01 study and atypical ductal hyperplasia however long-term data is unavailable and at this time we cannot for certain say it is equivalent to 20 mg dosing. She is however willing to proceed.  She also understands that interaction  with her antipsychotic medications may reduce the effectiveness of tamoxifen however she is still wanting to only try low-dose at this time.  If she tolerates it well, then she can consider trying full dose. We have discussed about considering annual MRIs any atypical ductal hyperplasia although this is a debatable area.  She will return to clinic in 3 months for tamoxifen toxicity check.  We can consider ordering MRI in March 2022.  She understands long-term risks of gadolinium deposition are unclear and MRI can lead to increased biopsies but no overall survival benefit.  Surveillance plan:consider annual MRI SBE monthly FU in 3 months   HISTORY OF PRESENTING ILLNESS:  Karen Avila 50 y.o. female is here because of ADH  This is a pleasant 50 year old female patient who was noted to have atypical ductal hyperplasia after recent left breast lumpectomy referred to high risk breast clinic for further recommendations.  Patient has undergone a screening mammogram and a 5 mm area of suspicion was evaluated in the left breast.  She underwent a biopsy of this which showed hyperplasia with some atypia.  She has strong family history of breast cancer hence surgical excision was recommended by her physicians in [facility].  She had a previous biopsy of her left breast back in 2004 which showed a papilloma.  Age at menarche: 19 Age at first child birth :35 Breast feeding : 4 months. Use of birth control: 10 yrs No use of HRT  She has strong FH of several cancers. She is anxious about trying tamoxifen since she has underlying hot flashes. She also has been taking some medications for mood so she is worried about trying the full dose.  She has been healing well, just post surgical changes she has noted in the left breast Rest of the pertinent 10 point ROS reviewed and neg.   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   COVID-19 long hauler    Had Covid-19 in 09/2020   Depression    Fibroid 2000   Gallstones    GERD (gastroesophageal reflux disease)    HLD (hyperlipidemia)    Hypertension    Migraine, unspecified migraine type    --complex   Mild dysplasia of cervix (CIN I) 2015, 2016   colposcopic biopsies   Pneumonia    Sleep apnea    Trichomonas infection 06/2017   Vitamin D deficiency 06/11/2015    SURGICAL HISTORY: Past Surgical History:  Procedure Laterality Date   ABDOMINAL HYSTERECTOMY     & oopherectomy   APPENDECTOMY  1977   BREAST LUMPECTOMY Left 2000   lumpectomy   BREAST LUMPECTOMY WITH RADIOACTIVE SEED LOCALIZATION Left 07/25/2021   Procedure: LEFT BREAST LUMPECTOMY WITH RADIOACTIVE SEED LOCALIZATION;  Surgeon: Coralie Keens, MD;  Location: [facility];  Service: General;  Laterality: Left;   CHOLECYSTECTOMY  10/2015   --[facility]   CRANIECTOMY  2002   DILATION AND CURETTAGE OF UTERUS     GYNECOLOGIC CRYOSURGERY  1996   TONSILLECTOMY     UTERINE ARTERY EMBOLIZATION  2011    SOCIAL HISTORY: Social History   Socioeconomic History   Marital status: Divorced    Spouse name: Not on file   Number of children: 1   Years of education: Not on file   Highest education level: Not on file  Occupational History   Not on file  Tobacco Use   Smoking status: Never   Smokeless tobacco: Never  Vaping Use   Vaping Use: Never used  Substance and Sexual Activity   Alcohol use: Yes    Alcohol/week: 0.0 standard drinks    Comment: 1-2 glasses of wine/month   Drug use: No   Sexual activity: Not Currently    Partners: Male    Birth control/protection: Surgical    Comment: hysterectomy   Other Topics Concern   Not on file  Social History Narrative   Not on file   Social Determinants of Health   Financial Resource Strain: Not on file  Food Insecurity: Not on file  Transportation Needs: Not on file  Physical Activity: Not on file  Stress: Not on file  Social Connections: Not on file  Intimate Partner Violence: Not on file    FAMILY HISTORY: Family History  Problem Relation Age of Onset   Osteoarthritis Mother    Asthma Mother    Hyperlipidemia Mother    Hypertension Mother    Migraines Mother    Stroke Mother    Skin cancer Mother    Colon polyps Mother    Irritable bowel syndrome Mother    Hyperlipidemia Father    Hypertension Father    Diabetes Father 62       pre diabetic   Diabetes Sister 85  now insukin pump   Breast cancer Maternal Grandmother    Heart failure Maternal Grandmother    Mom had breast cancer at age 24 and had partial mastectomy and radiation therapy Mom's sister has uterine serous carcinoma in her 46's Another maternal aunt has skin SCC Maternal aunt's daughter had melanoma. Another maternal aunt's daughter had cervical cancer Maternal uncle had prostate cancer, 2 uncles, no one died from prostate cancer. Maternal grandmother had BC at age 33. Maternal great grandmother died at 20 from uterine cancer.  Mom had genetic testing and tested negative for pathogenic mutations. 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  MEDICATIONS:  Current Outpatient Medications  Medication Sig Dispense Refill   acetaminophen (TYLENOL) 325 MG tablet Take 650 mg by mouth every 6 (six) hours as needed for moderate pain.     aspirin 81 MG EC tablet Take 81 mg by mouth daily.     baclofen (LIORESAL) 10 MG tablet Take 10 mg by mouth at bedtime.  0   buPROPion (WELLBUTRIN XL) 150 MG 24 hr tablet Take 150 mg by mouth every morning.     CHASTE TREE PO Take by mouth.     FLUoxetine (PROZAC) 20 MG capsule Take 20 mg by mouth  daily.     losartan (COZAAR) 50 MG tablet Take 50 mg by mouth daily.     metoprolol succinate (TOPROL-XL) 25 MG 24 hr tablet Take 25 mg by mouth daily.     Multiple Vitamin (MULTI-VITAMINS) TABS Take 1 tablet by mouth daily.     Probiotic Product (PROBIOTIC PEARLS WOMENS) CAPS Take 1 capsule by mouth daily.     sodium chloride (OCEAN) 0.65 % SOLN nasal spray Place 1 spray into both nostrils as needed for congestion.     SUMAtriptan (IMITREX) 25 MG tablet Take 25 mg by mouth every 2 (two) hours as needed for migraine or headache. May repeat in 2 hours if headache persists or recurs.     No current facility-administered medications for this visit.    PHYSICAL EXAMINATION: ECOG PERFORMANCE STATUS: 0 - Asymptomatic  Vitals:   08/22/21 0959  BP: (!) 102/54  Pulse: (!) 59  Resp: 18  Temp: (!) 97 F (36.1 C)  SpO2: 99%   Filed Weights   08/22/21 0959  Weight: 151 lb 6 oz (68.7 kg)    PE deferred in lieu of counseling  LABORATORY DATA:  I have reviewed the data as listed Lab Results  Component Value Date   WBC 6.6 07/17/2021   HGB 11.8 (L) 07/17/2021   HCT 36.8 07/17/2021   MCV 89.8 07/17/2021   PLT 378 07/17/2021     Chemistry      Component Value Date/Time   NA 131 (L) 07/17/2021 1044   NA 136 04/17/2017 1428   K 4.4 07/17/2021 1044   CL 99 07/17/2021 1044   CO2 22 07/17/2021 1044   BUN 11 07/17/2021 1044   BUN 7 04/17/2017 1428   CREATININE 0.73 07/17/2021 1044   CREATININE 0.70 04/04/2016 1324      Component Value Date/Time   CALCIUM 8.9 07/17/2021 1044   ALKPHOS 71 04/17/2017 1428   AST 16 04/17/2017 1428   ALT 16 04/17/2017 1428   BILITOT 0.3 04/17/2017 1428       RADIOGRAPHIC STUDIES: I have personally reviewed the radiological images as listed and agreed with the findings in the report. MM Breast Surgical Specimen  Result Date: 07/25/2021 CLINICAL DATA:  Status post  seed localized LEFT lumpectomy. EXAM: SPECIMEN RADIOGRAPH OF THE LEFT BREAST  COMPARISON:  Previous exam(s). FINDINGS: Status post excision of the left breast. The radioactive seed and X shaped biopsy marker clip are present, completely intact, and were marked for pathology. IMPRESSION: Specimen radiograph of the left breast. Electronically Signed   By: Nolon Nations M.D.   On: 07/25/2021 09:26  MM LT RADIOACTIVE SEED LOC MAMMO GUIDE  Result Date: 07/23/2021 CLINICAL DATA:  Localization prior to surgery EXAM: MAMMOGRAPHIC GUIDED RADIOACTIVE SEED LOCALIZATION OF THE LEFT BREAST COMPARISON:  Previous exam(s). FINDINGS: Patient presents for radioactive seed localization prior to surgery. I met with the patient and we discussed the procedure of seed localization including benefits and alternatives. We discussed the high likelihood of a successful procedure. We discussed the risks of the procedure including infection, bleeding, tissue injury and further surgery. We discussed the low dose of radioactivity involved in the procedure. Informed, written consent was given. The usual time-out protocol was performed immediately prior to the procedure. Using mammographic guidance, sterile technique, 1% lidocaine and an I-125 radioactive seed, the region of remaining distortion was localized using a lateral approach. The follow-up mammogram images confirm the seed in the expected location and were marked for the surgeon. Follow-up survey of the patient confirms presence of the radioactive seed. Order number of I-125 seed:  [id number]. Total activity:  9.597 millicuries reference Date: July 02, 2021 The patient tolerated the procedure well and was released from the [place]. She was given instructions regarding seed removal. IMPRESSION: Radioactive seed localization left breast. No apparent complications. Electronically Signed   By: Dorise Bullion III M.D.   On: 07/23/2021 14:49    FINAL MICROSCOPIC DIAGNOSIS:   A. BREAST, LEFT, LUMPECTOMY:  - Complex sclerosing lesion with focal atypical  ductal hyperplasia and  florid usual ductal hyperplasia.  See comment.   COMMENT:   Dr. Tresa Moore concurs with the diagnosis.   All questions were answered. The patient knows to call the clinic with any problems, questions or concerns. I spent 45 minutes in the care of this patient including H and P, review of records, counseling and coordination of care.     Benay Pike, MD 08/22/2021 10:10 AM

## 2021-11-22 ENCOUNTER — Inpatient Hospital Stay: Payer: No Typology Code available for payment source | Admitting: Hematology and Oncology

## 2021-11-22 NOTE — Progress Notes (Signed)
Patient moved, will cancel the appointment.This encounter was created in error - please disregard.

## 2021-11-22 NOTE — Progress Notes (Deleted)
[facility] CONSULT NOTE  Patient Care Team: Nicola Girt, DO as PCP - General (Internal Medicine)  CHIEF COMPLAINTS/PURPOSE OF CONSULTATION:  ADH  ASSESSMENT & PLAN:  This is a very pleasant 51 year old female patient with recently diagnosed atypical ductal hyperplasia referred to high risk breast clinic for further recommendation.  Pathology review: I discussed the difference between atypical ductal hyperplasia, DCIS and invasive breast cancer. I explained to her that atypical ductal hyperplasia  is characterized by a proliferation of uniform epithelial cells filling part, but not the entirety, of the involved duct. ADH is associated with an increased risk of both ipsilateral and contralateral breast cancer and thus provides evidence of underlying breast abnormalities that predispose to breast cancer.  Given strong family history of breast cancer, recent diagnosis of atypical ductal hyperplasia, we have discussed about endocrine therapy to reduce her risk of breast cancer.  Since she is not clearly most postmenopausal, we have focused our discussion on tamoxifen.  I discussed the risks and benefits of tamoxifen therapy. I believe that the risks far outweigh any benefits from tamoxifen. Instead I recommended the following 1. Exercise 30 minutes daily or 150 minutes a week. 2. Weight loss 3. Increasing fruits and vegetables and less red meat I believe all of the above would extensively decrease her risk of breast cancer as well as other cancers including cancers of the colon substantially.  She is very worried about trying tamoxifen 20 mg and would like to try the low-dose tamoxifen.  We have discussed that low-dose tamoxifen has been studied in TAM 01 study and atypical ductal hyperplasia however long-term data is unavailable and at this time we cannot for certain say it is equivalent to 20 mg dosing. She is however willing to proceed.  She also understands that interaction  with her antipsychotic medications may reduce the effectiveness of tamoxifen however she is still wanting to only try low-dose at this time.  If she tolerates it well, then she can consider trying full dose. We have discussed about considering annual MRIs any atypical ductal hyperplasia although this is a debatable area.  She will return to clinic in 3 months for tamoxifen toxicity check.  We can consider ordering MRI in March 2022.  She understands long-term risks of gadolinium deposition are unclear and MRI can lead to increased biopsies but no overall survival benefit.  Surveillance plan:consider annual MRI SBE monthly FU in 3 months   HISTORY OF PRESENTING ILLNESS:  Karen Avila 51 y.o. female is here because of ADH  This is a pleasant 51 year old female patient who was noted to have atypical ductal hyperplasia after recent left breast lumpectomy referred to high risk breast clinic for further recommendations.  Patient has undergone a screening mammogram and a 5 mm area of suspicion was evaluated in the left breast.  She underwent a biopsy of this which showed hyperplasia with some atypia.  She has strong family history of breast cancer hence surgical excision was recommended by her physicians in [facility].  She had a previous biopsy of her left breast back in 2004 which showed a papilloma.  Age at menarche: 41 Age at first child birth :60 Breast feeding : 4 months. Use of birth control: 10 yrs No use of HRT  She has strong FH of several cancers. She is anxious about trying tamoxifen since she has underlying hot flashes. She also has been taking some medications for mood so she is worried about trying the full dose.  She has been healing well, just post surgical changes she has noted in the left breast Rest of the pertinent 10 point ROS reviewed and neg.   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   COVID-19 long hauler    Had Covid-19 in 09/2020   Depression    Fibroid 2000   Gallstones    GERD (gastroesophageal reflux disease)    HLD (hyperlipidemia)    Hypertension    Migraine, unspecified migraine type    --complex   Mild dysplasia of cervix (CIN I) 2015, 2016   colposcopic biopsies   Pneumonia    Sleep apnea    Trichomonas infection 06/2017   Vitamin D deficiency 06/11/2015    SURGICAL HISTORY: Past Surgical History:  Procedure Laterality Date   ABDOMINAL HYSTERECTOMY     & oopherectomy   APPENDECTOMY  1977   BREAST LUMPECTOMY Left 2000   lumpectomy   BREAST LUMPECTOMY WITH RADIOACTIVE SEED LOCALIZATION Left 07/25/2021   Procedure: LEFT BREAST LUMPECTOMY WITH RADIOACTIVE SEED LOCALIZATION;  Surgeon: Coralie Keens, MD;  Location: [facility] [place];  Service: General;  Laterality: Left;   CHOLECYSTECTOMY  10/2015   --[facility]   CRANIECTOMY  2002   DILATION AND CURETTAGE OF UTERUS     GYNECOLOGIC CRYOSURGERY  1996   TONSILLECTOMY     UTERINE ARTERY EMBOLIZATION  2011    SOCIAL HISTORY: Social History   Socioeconomic History   Marital status: Divorced    Spouse name: Not on file   Number of children: 1   Years of education: Not on file   Highest education level: Not on file  Occupational History   Not on file  Tobacco Use   Smoking status: Never   Smokeless tobacco: Never  Vaping Use   Vaping Use: Never used  Substance and Sexual Activity   Alcohol use: Yes    Alcohol/week: 0.0 standard drinks    Comment: 1-2 glasses of wine/month   Drug use: No   Sexual activity: Not Currently    Partners: Male    Birth control/protection: Surgical    Comment: hysterectomy   Other Topics Concern   Not on file  Social History Narrative   Not on file   Social Determinants of Health   Financial Resource Strain: Not on file  Food Insecurity: Not on file  Transportation Needs: Not on file  Physical Activity: Not on file  Stress: Not on file  Social Connections: Not on file  Intimate Partner Violence: Not on file    FAMILY HISTORY: Family History  Problem Relation Age of Onset   Osteoarthritis Mother    Asthma Mother    Hyperlipidemia Mother    Hypertension Mother    Migraines Mother    Stroke Mother    Skin cancer Mother    Colon polyps Mother    Irritable bowel syndrome Mother    Hyperlipidemia Father    Hypertension Father    Diabetes Father 13       pre diabetic   Diabetes Sister 34  now insukin pump   Breast cancer Maternal Grandmother    Heart failure Maternal Grandmother    Mom had breast cancer at age 63 and had partial mastectomy and radiation therapy Mom's sister has uterine serous carcinoma in her 75's Another maternal aunt has skin SCC Maternal aunt's daughter had melanoma. Another maternal aunt's daughter had cervical cancer Maternal uncle had prostate cancer, 2 uncles, no one died from prostate cancer. Maternal grandmother had BC at age 58. Maternal great grandmother died at 85 from uterine cancer.  Mom had genetic testing and tested negative for pathogenic mutations. 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  MEDICATIONS:  Current Outpatient Medications  Medication Sig Dispense Refill   acetaminophen (TYLENOL) 325 MG tablet Take 650 mg by mouth every 6 (six) hours as needed for moderate pain.     aspirin 81 MG EC tablet Take 81 mg by mouth daily.     baclofen (LIORESAL) 10 MG tablet Take 10 mg by mouth at bedtime.  0   buPROPion (WELLBUTRIN XL) 150 MG 24 hr tablet Take 150 mg by mouth every morning.     CHASTE TREE PO Take by mouth.     FLUoxetine (PROZAC) 20 MG capsule Take 20 mg by mouth  daily.     losartan (COZAAR) 50 MG tablet Take 50 mg by mouth daily.     metoprolol succinate (TOPROL-XL) 25 MG 24 hr tablet Take 25 mg by mouth daily.     Multiple Vitamin (MULTI-VITAMINS) TABS Take 1 tablet by mouth daily.     Probiotic Product (PROBIOTIC PEARLS WOMENS) CAPS Take 1 capsule by mouth daily.     sodium chloride (OCEAN) 0.65 % SOLN nasal spray Place 1 spray into both nostrils as needed for congestion.     SUMAtriptan (IMITREX) 25 MG tablet Take 25 mg by mouth every 2 (two) hours as needed for migraine or headache. May repeat in 2 hours if headache persists or recurs.     tamoxifen (NOLVADEX) 10 MG tablet Take 0.5 tablets (5 mg total) by mouth daily. 30 tablet 2   No current facility-administered medications for this visit.    PHYSICAL EXAMINATION: ECOG PERFORMANCE STATUS: 0 - Asymptomatic  There were no vitals filed for this visit.  There were no vitals filed for this visit.   PE deferred in lieu of counseling  LABORATORY DATA:  I have reviewed the data as listed Lab Results  Component Value Date   WBC 6.6 07/17/2021   HGB 11.8 (L) 07/17/2021   HCT 36.8 07/17/2021   MCV 89.8 07/17/2021   PLT 378 07/17/2021     Chemistry      Component Value Date/Time   NA 131 (L) 07/17/2021 1044   NA 136 04/17/2017 1428   K 4.4 07/17/2021 1044   CL 99 07/17/2021 1044   CO2 22 07/17/2021 1044   BUN 11 07/17/2021 1044   BUN 7 04/17/2017 1428   CREATININE 0.73 07/17/2021 1044   CREATININE 0.70 04/04/2016 1324      Component Value Date/Time   CALCIUM 8.9 07/17/2021 1044   ALKPHOS 71 04/17/2017 1428   AST 16 04/17/2017 1428   ALT 16 04/17/2017 1428   BILITOT 0.3 04/17/2017 1428       RADIOGRAPHIC STUDIES: I have personally reviewed the radiological images as listed and agreed with the findings in the report. No results found.   FINAL MICROSCOPIC DIAGNOSIS:   A. BREAST, LEFT, LUMPECTOMY:  - Complex sclerosing lesion with  focal atypical ductal hyperplasia and   florid usual ductal hyperplasia.  See comment.   COMMENT:   Dr. Tresa Moore concurs with the diagnosis.   All questions were answered. The patient knows to call the clinic with any problems, questions or concerns. I spent 45 minutes in the care of this patient including H and P, review of records, counseling and coordination of care.     Benay Pike, MD 11/22/2021 2:01 PM

## 2022-02-26 ENCOUNTER — Encounter (HOSPITAL_COMMUNITY): Payer: Self-pay

## 2023-07-01 IMAGING — MG MM PLC BREAST LOC DEV 1ST LESION INC MAMMO GUIDE*L*
8 series · 8 of 8 positions shown · non-contrast
Comparison: Previous exam(s).

CLINICAL DATA: Localization prior to surgery

EXAM:
MAMMOGRAPHIC GUIDED RADIOACTIVE SEED LOCALIZATION OF THE LEFT BREAST

[L CC (1 of 5)]
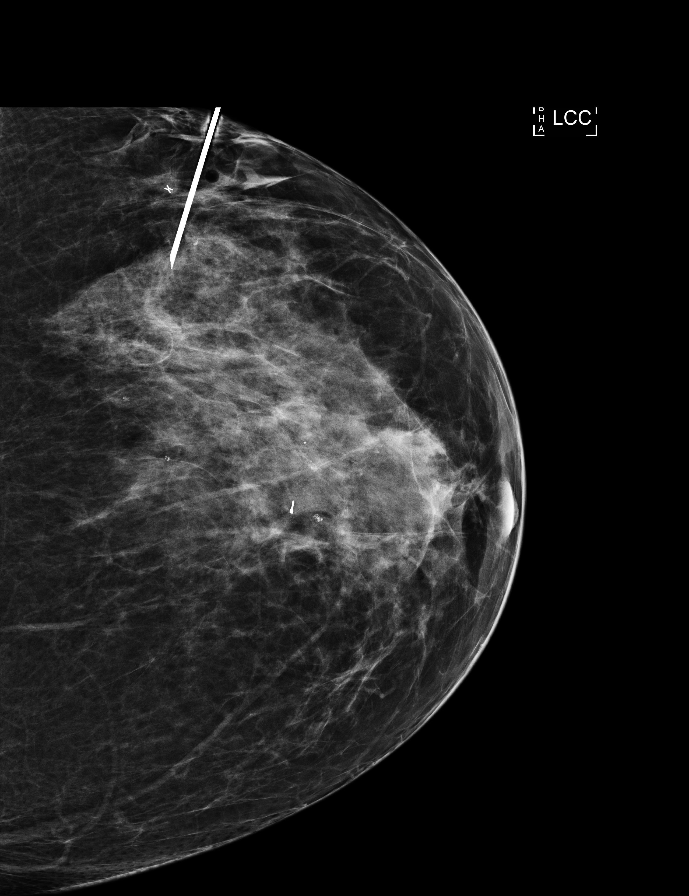

[L LM (1 of 3)]
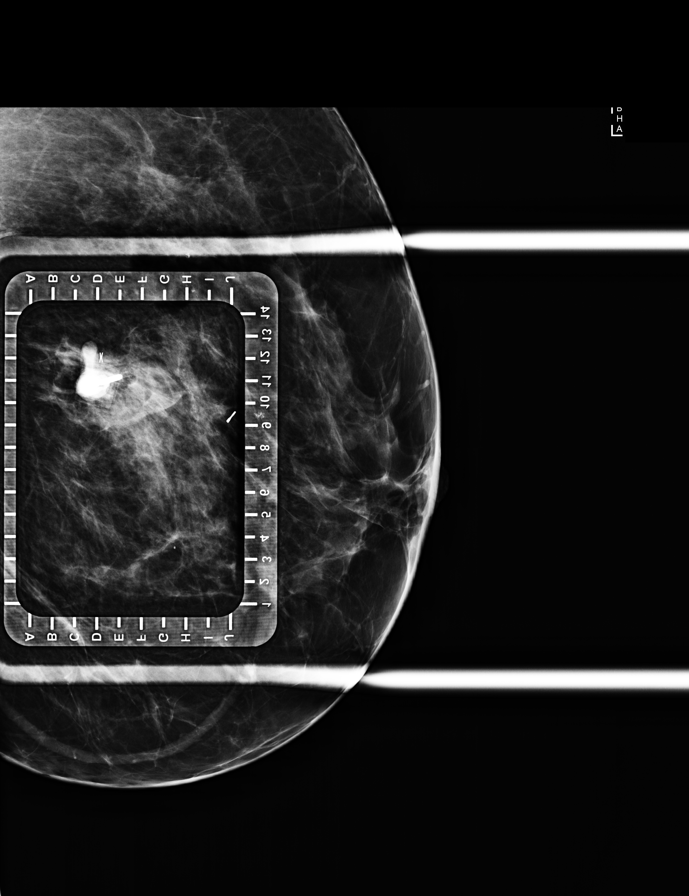

[L CC (2 of 5)]
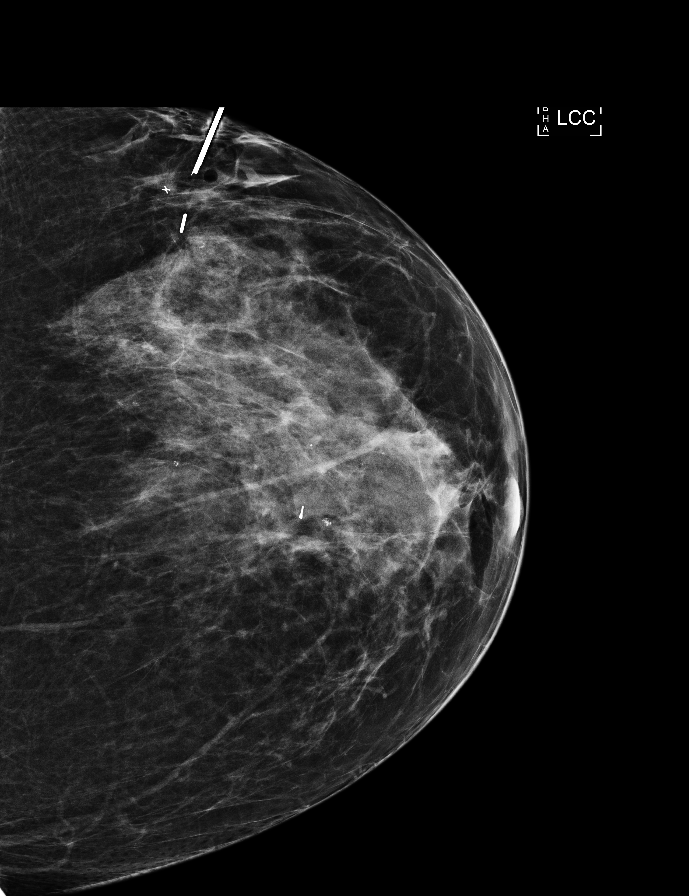

[L CC (3 of 5)]
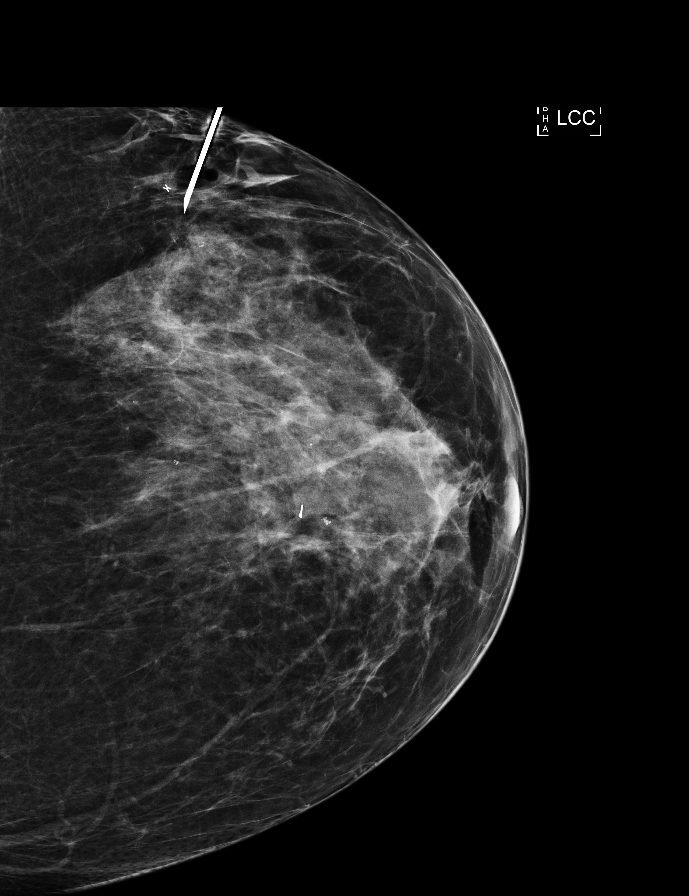

[L LM (2 of 3)]
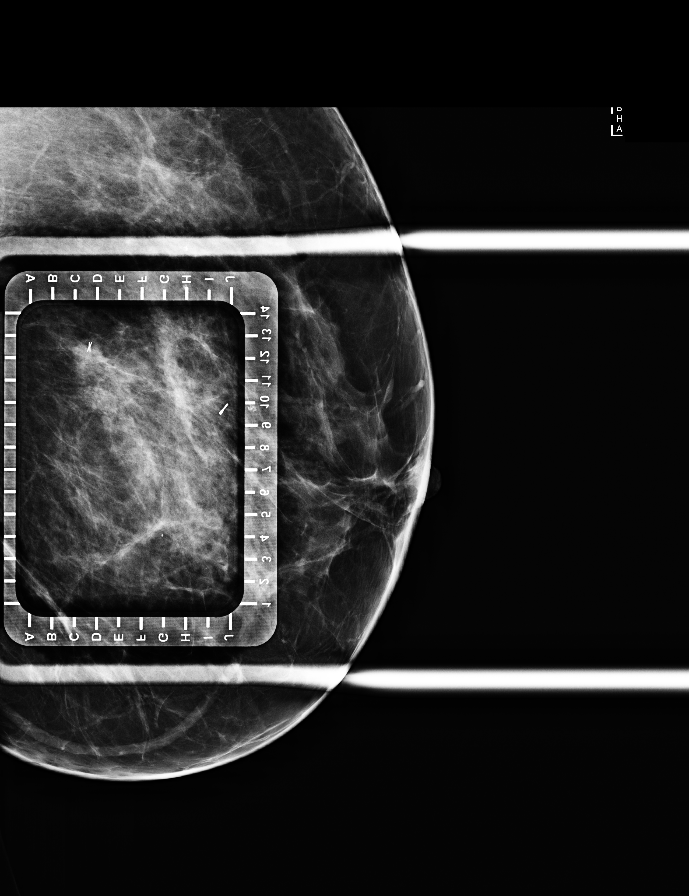

[L CC (4 of 5)]
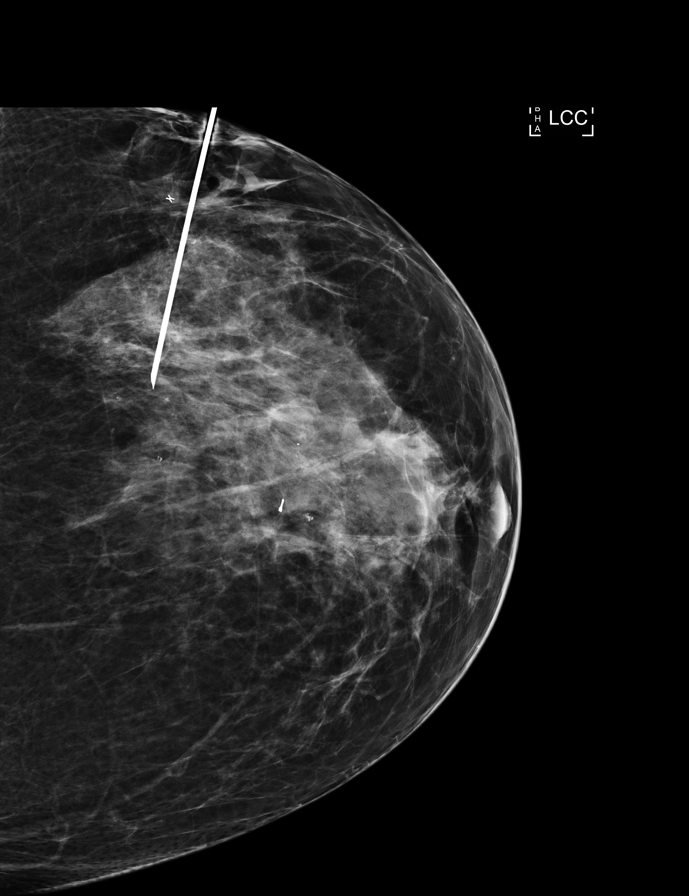

[L LM (3 of 3)]
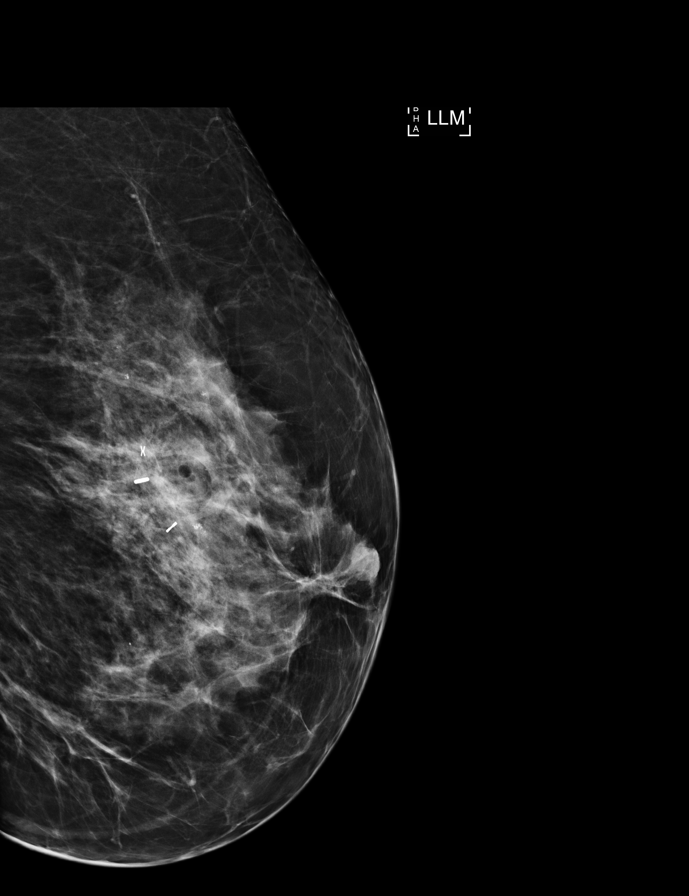

[L CC (5 of 5)]
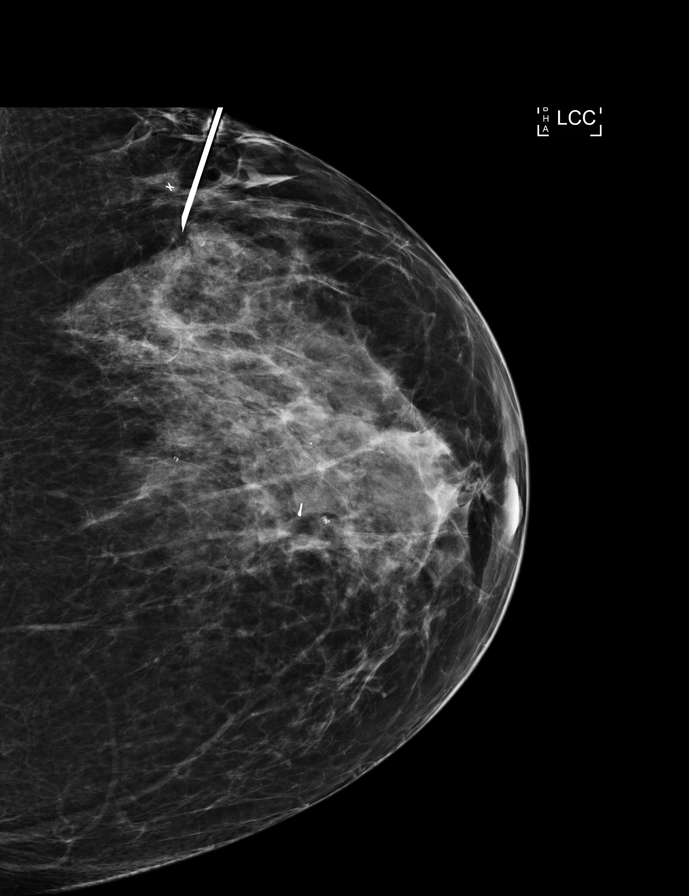

[8 of 8 positions shown; findings below may reference images not displayed]



The usual time-out protocol was performed immediately prior to the
procedure.

Using mammographic guidance, sterile technique, 1% lidocaine and an
0-D3U radioactive seed, the region of remaining distortion was
localized using a lateral approach. The follow-up mammogram images
confirm the seed in the expected location and were marked for the
surgeon.

Follow-up survey of the patient confirms presence of the radioactive
seed.

Order number of 0-D3U seed:  [id number].

Total activity:  0.238 millicuries reference Date: July 02, 2021

The patient tolerated the procedure well and was released from the
[REDACTED]. She was given instructions regarding seed removal.
IMPRESSION: Radioactive seed localization left breast. No apparent
complications.

## 2023-07-03 IMAGING — MG MM BREAST SURGICAL SPECIMEN
1 series · 1 of 1 positions shown · non-contrast
Comparison: Previous exam(s).

CLINICAL DATA: Status post seed localized LEFT lumpectomy.

EXAM:
SPECIMEN RADIOGRAPH OF THE LEFT BREAST

[L]
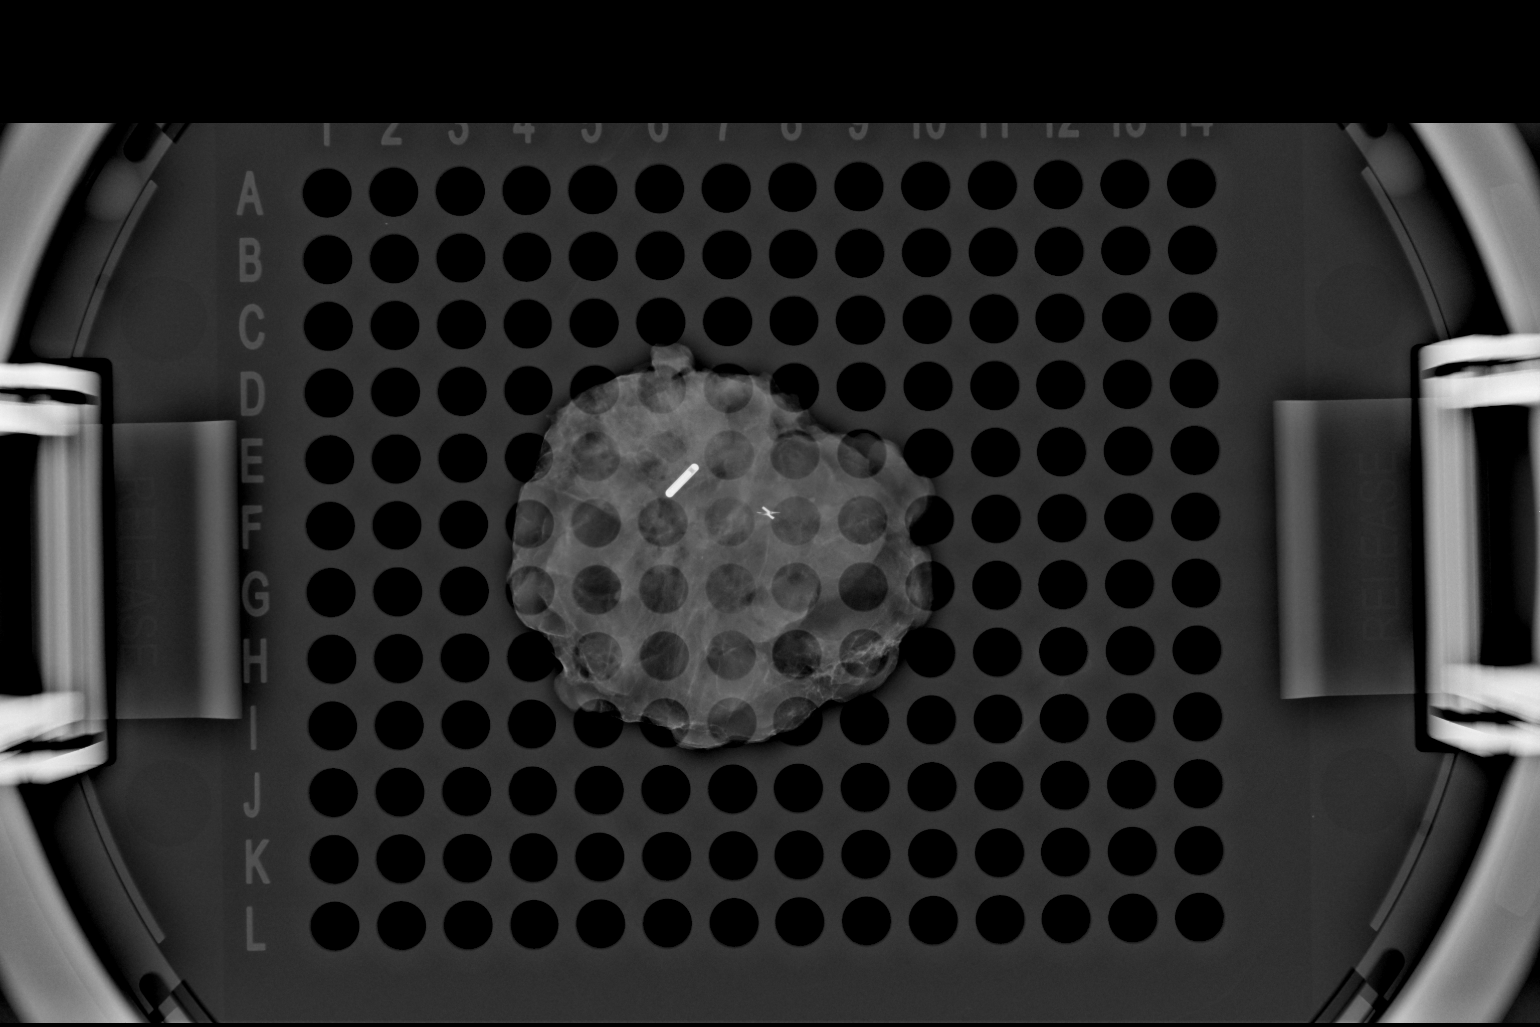

[1 of 1 positions shown; findings below may reference images not displayed]

FINDINGS: Status post excision of the left breast. The radioactive seed and X
shaped biopsy marker clip are present, completely intact, and were
marked for pathology.
IMPRESSION: Specimen radiograph of the left breast.
